# Patient Record
Sex: Male | Born: 1970 | Race: White | Hispanic: No | Marital: Married | Smoking: Never smoker
Health system: Southern US, Community
[De-identification: ages and names within clinical notes are randomized; demographics above are authoritative.]

## PROBLEM LIST (undated history)

## (undated) DIAGNOSIS — J309 Allergic rhinitis, unspecified: Secondary | ICD-10-CM

## (undated) HISTORY — DX: Allergic rhinitis, unspecified: J30.9

---

## 2013-07-31 DIAGNOSIS — J309 Allergic rhinitis, unspecified: Secondary | ICD-10-CM

## 2013-07-31 HISTORY — DX: Allergic rhinitis, unspecified: J30.9

## 2014-07-13 ENCOUNTER — Emergency Department
Admission: EM | Admit: 2014-07-13 | Discharge: 2014-07-13 | Disposition: A | Discharge status: Home / Self Care | Payer: 59 | Source: Home / Self Care | Attending: Emergency Medicine | Admitting: Emergency Medicine

## 2014-07-13 ENCOUNTER — Encounter: Payer: Self-pay | Admitting: Emergency Medicine

## 2014-07-13 DIAGNOSIS — R112 Nausea with vomiting, unspecified: Secondary | ICD-10-CM

## 2014-07-13 MED ORDER — ONDANSETRON 4 MG PO TBDP
4.0000 mg | ORAL_TABLET | Freq: Once | ORAL | Status: AC
  Administered 2014-07-13: 4 mg via ORAL

## 2014-07-13 MED ORDER — PROMETHAZINE HCL 25 MG PO TABS: 25.0000 mg | ORAL_TABLET | ORAL | Status: DC | PRN

## 2014-07-13 NOTE — Discharge Instructions (Signed)

## 2014-07-13 NOTE — ED Provider Notes (Signed)
CSN: [id number]     Arrival date & time 07/13/14  1525 History   First MD Initiated Contact with Patient 07/13/14 1545     Chief Complaint  Patient presents with  . Nausea  . Emesis   (Consider location/radiation/quality/duration/timing/severity/associated sxs/prior Treatment) Patient is a 43 y.o. male presenting with vomiting. The history is provided by the patient. No language interpreter was used.  Emesis Severity:  Moderate Duration:  3 days Timing:  Constant Number of daily episodes:  Multiple Quality:  Unable to specify Able to tolerate:  Liquids Progression:  Improving Chronicity:  New Recent urination:  Normal Relieved by:  Nothing Worsened by:  Nothing tried Ineffective treatments:  None tried Associated symptoms: no sore throat   Risk factors: no sick contacts     History reviewed. No pertinent past medical history. History reviewed. No pertinent past surgical history. History reviewed. No pertinent family history. History  Substance Use Topics  . Smoking status: Never Smoker   . Smokeless tobacco: Never Used  . Alcohol Use: No    Review of Systems  HENT: Negative for sore throat.   Gastrointestinal: Positive for nausea and vomiting.  All other systems reviewed and are negative.   Allergies  Review of patient's allergies indicates no known allergies.  Home Medications   Prior to Admission medications   Medication Sig Start Date End Date Taking? Authorizing Provider  cetirizine (ZYRTEC) 10 MG tablet Take 10 mg by mouth daily.   Yes Historical Provider, MD  promethazine (PHENERGAN) 25 MG tablet Take 1 tablet (25 mg total) by mouth every 4 (four) hours as needed for nausea or vomiting. 07/13/14   Lonia SkinnerLeslie K Sofia, PA-C   BP 120/80 mmHg  Pulse 55  Temp(Src) 98.1 F (36.7 C) (Oral)  Resp 16  Ht 5\' 10"  (1.778 m)  Wt 171 lb (77.565 kg)  BMI 24.54 kg/m2  SpO2 100% Physical Exam  Constitutional: He is oriented to person, place, and time. He appears  well-developed and well-nourished.  HENT:  Head: Normocephalic and atraumatic.  Right Ear: External ear normal.  Left Ear: External ear normal.  Eyes: Conjunctivae and EOM are normal. Pupils are equal, round, and reactive to light.  Neck: Normal range of motion.  Cardiovascular: Normal rate and normal heart sounds.   Pulmonary/Chest: Effort normal.  Abdominal: Soft. He exhibits no distension. There is tenderness.  Musculoskeletal: Normal range of motion.  Neurological: He is alert and oriented to person, place, and time.  Skin: Skin is warm.  Psychiatric: He has a normal mood and affect.  Nursing note and vitals reviewed.   ED Course  Procedures (including critical care time) Labs Review Labs Reviewed - No data to display  Imaging Review No results found.   MDM Probable viral illness.  Pt requesting phenergan.   Pt reports he has had in the past and has worked well.     1. Nausea and vomiting, vomiting of unspecified type    Pt advised to see Dr. Drue SecondSnider if symptoms persist past one week.  AVS  Elson AreasLeslie K Sofia, PA-C 07/13/14 [phone]

## 2014-07-13 NOTE — ED Notes (Signed)
Intermittent nausea, vomiting since Tuesday evening. Last episode yesterday at 8pm.  Symptoms worse at night.  Also complains of mild headache rating 4/10.

## 2014-08-13 ENCOUNTER — Ambulatory Visit (INDEPENDENT_AMBULATORY_CARE_PROVIDER_SITE_OTHER): Payer: 59 | Admitting: Family Medicine

## 2014-08-13 ENCOUNTER — Encounter: Payer: Self-pay | Admitting: Family Medicine

## 2014-08-13 VITALS — BP 122/70 | HR 64 | Ht 70.0 in | Wt 169.0 lb

## 2014-08-13 DIAGNOSIS — Z0000 Encounter for general adult medical examination without abnormal findings: Secondary | ICD-10-CM

## 2014-08-13 NOTE — Progress Notes (Signed)
CC: Willie RasmussenBrian Edwards is a 43 y.o. male is here for Establish Care and need CPE for insurance;no other concerns   Subjective: HPI:  Colonoscopy: no family history of colon cancer will begin screening at age 89 Prostate:no family history of prostate cancer will begin screening at age 43   Influenza Vaccine: declined Pneumovax: No current indication Td/Tdap: within last five years, outside records have been requested Zoster: (Start 41 yo)  Pleasant 43 year old, friend of Casey Burkittaron Saunders here to establish care  Rare alcohol use no tobacco or recreational drug use. Exercises 5 times a week with running and strength training.  Review of Systems - General ROS: negative for - chills, fever, night sweats, weight gain or weight loss Ophthalmic ROS: negative for - decreased vision Psychological ROS: negative for - anxiety or depression ENT ROS: negative for - hearing change, nasal congestion, tinnitus or allergies Hematological and Lymphatic ROS: negative for - bleeding problems, bruising or swollen lymph nodes Breast ROS: negative Respiratory ROS: no cough, shortness of breath, or wheezing Cardiovascular ROS: no chest pain or dyspnea on exertion Gastrointestinal ROS: no abdominal pain, change in bowel habits, or black or bloody stools Genito-Urinary ROS: negative for - genital discharge, genital ulcers, incontinence or abnormal bleeding from genitals Musculoskeletal ROS: negative for - joint pain or muscle pain Neurological ROS: negative for - headaches or memory loss Dermatological ROS: negative for lumps, mole changes, rash and skin lesion changes  History reviewed. No pertinent past medical history.  History reviewed. No pertinent past surgical history. Family History  Problem Relation Age of Onset  . Diabetes      grandmother     History   Social History  . Marital Status: Married    Spouse Name: N/A    Number of Children: N/A  . Years of Education: N/A   Occupational History  .  Not on file.   Social History Main Topics  . Smoking status: Never Smoker   . Smokeless tobacco: Never Used  . Alcohol Use: No  . Drug Use: No  . Sexual Activity:    Partners: Female   Other Topics Concern  . Not on file   Social History Narrative     Objective: BP 122/70 mmHg  Pulse 64  Ht 5\' 10"  (1.778 m)  Wt 169 lb (76.658 kg)  BMI 24.25 kg/m2  General: No Acute Distress HEENT: Atraumatic, normocephalic, conjunctivae normal without scleral icterus.  No nasal discharge, hearing grossly intact, TMs with good landmarks bilaterally with no middle ear abnormalities, posterior pharynx clear without oral lesions. Neck: Supple, trachea midline, no cervical nor supraclavicular adenopathy. Pulmonary: Clear to auscultation bilaterally without wheezing, rhonchi, nor rales. Cardiac: Regular rate and rhythm.  No murmurs, rubs, nor gallops. No peripheral edema.  2+ peripheral pulses bilaterally. Abdomen: Bowel sounds normal.  No masses.  Non-tender without rebound.  Negative Murphy's sign. GU: Bilateral descended testes with no inguinal hernia  MSK: Grossly intact, no signs of weakness.  Full strength throughout upper and lower extremities.  Full ROM in upper and lower extremities.  No midline spinal tenderness. Neuro: Gait unremarkable, CN II-XII grossly intact.  C5-C6 Reflex 2/4 Bilaterally, L4 Reflex 2/4 Bilaterally.  Cerebellar function intact. Skin: No rashes. Scattered cherry angiomas on the trunk Psych: Alert and oriented to person/place/time.  Thought process normal. No anxiety/depression.  Assessment & Plan: Willie JohnBrian was seen today for establish care and need cpe for insurance;no other concerns.  Diagnoses and associated orders for this visit:  Annual physical exam - Lipid  panel - COMPLETE METABOLIC PANEL WITH GFR - CBC    Healthy lifestyle interventions including but not limited to regular exercise, a healthy low fat diet, moderation of salt intake, the dangers of  tobacco/alcohol/recreational drug use, nutrition supplementation, and accident avoidance were discussed with the patient and a handout was provided for future reference.  Return if symptoms worsen or fail to improve.

## 2014-08-13 NOTE — Patient Instructions (Signed)
Dr. Tyrian Peart's General Advice Following Your Complete Physical Exam  The Benefits of Regular Exercise: Unless you suffer from an uncontrolled cardiovascular condition, studies strongly suggest that regular exercise and physical activity will add to both the quality and length of your life.  The World Health Organization recommends 150 minutes of moderate intensity aerobic activity every week.  This is best split over 3-4 days a week, and can be as simple as a brisk walk for just over 35 minutes "most days of the week".  This type of exercise has been shown to lower LDL-Cholesterol, lower average blood sugars, lower blood pressure, lower cardiovascular disease risk, improve memory, and increase one's overall sense of wellbeing.  The addition of anaerobic (or "strength training") exercises offers additional benefits including but not limited to increased metabolism, prevention of osteoporosis, and improved overall cholesterol levels.  How Can I Strive For A Low-Fat Diet?: Current guidelines recommend that 25-35 percent of your daily energy (food) intake should come from fats.  One might ask how can this be achieved without having to dissect each meal on a daily basis?  Switch to skim or 1% milk instead of whole milk.  Focus on lean meats such as ground turkey, fresh fish, baked chicken, and lean cuts of beef as your source of dietary protein.  Limit saturated fat consumption to less than 10% of your daily caloric intake.  Limit trans fatty acid consumption primarily by limiting synthetic trans fats such as partially hydrogenated oils (Ex: fried fast foods).  Substitute olive or vegetable oil for solid fats where possible.  Moderation of Salt Intake: Provided you don't carry a diagnosis of congestive heart failure nor renal failure, I recommend a daily allowance of no more than 2300 mg of salt (sodium).  Keeping under this daily goal is associated with a decreased risk of cardiovascular events, creeping  above it can lead to elevated blood pressures and increases your risk of cardiovascular events.  Milligrams (mg) of salt is listed on all nutrition labels, and your daily intake can add up faster than you think.  Most canned and frozen dinners can pack in over half your daily salt allowance in one meal.    Lifestyle Health Risks: Certain lifestyle choices carry specific health risks.  As you may already know, tobacco use has been associated with increasing one's risk of cardiovascular disease, pulmonary disease, numerous cancers, among many other issues.  What you may not know is that there are medications and nicotine replacement strategies that can more than double your chances of successfully quitting.  I would be thrilled to help manage your quitting strategy if you currently use tobacco products.  When it comes to alcohol use, I've yet to find an "ideal" daily allowance.  Provided an individual does not have a medical condition that is exacerbated by alcohol consumption, general guidelines determine "safe drinking" as no more than two standard drinks for a man or no more than one standard drink for a male per day.  However, much debate still exists on whether any amount of alcohol consumption is technically "safe".  My general advice, keep alcohol consumption to a minimum for general health promotion.  If you or others believe that alcohol, tobacco, or recreational drug use is interfering with your life, I would be happy to provide confidential counseling regarding treatment options.  General "Over The Counter" Nutrition Advice: Postmenopausal women should aim for a daily calcium intake of 1200 mg, however a significant portion of this might already be   provided by diets including milk, yogurt, cheese, and other dairy products.  Vitamin D has been shown to help preserve bone density, prevent fatigue, and has even been shown to help reduce falls in the elderly.  Ensuring a daily intake of 800 Units of  Vitamin D is a good place to start to enjoy the above benefits, we can easily check your Vitamin D level to see if you'd potentially benefit from supplementation beyond 800 Units a day.  Folic Acid intake should be of particular concern to women of childbearing age.  Daily consumption of 400-800 mcg of Folic Acid is recommended to minimize the chance of spinal cord defects in a fetus should pregnancy occur.    For many adults, accidents still remain one of the most common culprits when it comes to cause of death.  Some of the simplest but most effective preventitive habits you can adopt include regular seatbelt use, proper helmet use, securing firearms, and regularly testing your smoke and carbon monoxide detectors.  Willie Bennette B. Jakita Dutkiewicz DO [facility] [address], [address] , [place] [address] Phone: [phone]  

## 2014-08-20 ENCOUNTER — Encounter: Payer: 59 | Admitting: Family Medicine

## 2014-08-20 LAB — CBC
HCT: 45.2 % (ref 39.0–52.0)
HEMOGLOBIN: 15.1 g/dL (ref 13.0–17.0)
MCH: 27.2 pg (ref 26.0–34.0)
MCHC: 33.4 g/dL (ref 30.0–36.0)
MCV: 81.4 fL (ref 78.0–100.0)
MPV: 11.8 fL (ref 9.4–12.4)
PLATELETS: 152 10*3/uL (ref 150–400)
RBC: 5.55 MIL/uL (ref 4.22–5.81)
RDW: 14.6 % (ref 11.5–15.5)
WBC: 7.2 10*3/uL (ref 4.0–10.5)

## 2014-08-20 LAB — COMPLETE METABOLIC PANEL WITH GFR
ALT: 14 U/L (ref 0–53)
AST: 15 U/L (ref 0–37)
Albumin: 4.6 g/dL (ref 3.5–5.2)
Alkaline Phosphatase: 42 U/L (ref 39–117)
BILIRUBIN TOTAL: 0.7 mg/dL (ref 0.2–1.2)
BUN: 20 mg/dL (ref 6–23)
CO2: 30 mEq/L (ref 19–32)
CREATININE: 0.95 mg/dL (ref 0.50–1.35)
Calcium: 9.6 mg/dL (ref 8.4–10.5)
Chloride: 104 mEq/L (ref 96–112)
GFR, Est African American: >89 mL/min
Glucose, Bld: 90 mg/dL (ref 70–99)
Potassium: 4.4 mEq/L (ref 3.5–5.3)
Sodium: 141 mEq/L (ref 135–145)
Total Protein: 6.8 g/dL (ref 6.0–8.3)

## 2014-08-20 LAB — LIPID PANEL
CHOL/HDL RATIO: 3 ratio
Cholesterol: 139 mg/dL (ref 0–200)
HDL: 46 mg/dL (ref >39)
LDL Cholesterol: 80 mg/dL (ref 0–99)
TRIGLYCERIDES: 63 mg/dL (ref <150)
VLDL: 13 mg/dL (ref 0–40)

## 2014-11-12 ENCOUNTER — Encounter: Payer: Self-pay | Admitting: *Deleted

## 2014-11-12 ENCOUNTER — Emergency Department
Admission: EM | Admit: 2014-11-12 | Discharge: 2014-11-12 | Disposition: A | Discharge status: Home / Self Care | Payer: 59 | Source: Home / Self Care | Attending: Family Medicine | Admitting: Family Medicine

## 2014-11-12 ENCOUNTER — Emergency Department (INDEPENDENT_AMBULATORY_CARE_PROVIDER_SITE_OTHER): Payer: 59

## 2014-11-12 DIAGNOSIS — M940 Chondrocostal junction syndrome [Tietze]: Secondary | ICD-10-CM

## 2014-11-12 DIAGNOSIS — R0602 Shortness of breath: Secondary | ICD-10-CM | POA: Diagnosis not present

## 2014-11-12 MED ORDER — PREDNISONE 20 MG PO TABS: 20.0000 mg | ORAL_TABLET | Freq: Two times a day (BID) | ORAL | Status: DC

## 2014-11-12 NOTE — ED Provider Notes (Signed)
CSN: [id number]     Arrival date & time 11/12/14  1630 History   First MD Initiated Contact with Patient 11/12/14 1702     Chief Complaint  Patient presents with  . Shortness of Breath      HPI Comments: Patient complains of recurrent anterior chest tightness and sensation of dyspnea.  He has a history of costochondritis which seems to recur on a seasonal basis.  He generally runs 25 to 30 miles per week during warmer months, and has just resumed running.  He has no shortness of breath while running but feels chest tightness after finishing.  No cough or fever.  Patient is a 44 y.o. male presenting with chest pain. The history is provided by the patient.  Chest Pain Pain location:  Substernal area Pain quality: aching   Pain radiates to:  Does not radiate Pain radiates to the back: no   Pain severity:  Mild Onset quality:  Gradual Duration:  2 days Timing:  Constant Progression:  Unchanged Chronicity:  Recurrent Context: at rest   Context: not breathing, not eating, no movement, not raising an arm and no trauma   Relieved by:  Nothing Worsened by:  Nothing tried Ineffective treatments:  None tried Associated symptoms: shortness of breath   Associated symptoms: no abdominal pain, no AICD problem, no back pain, no cough, no diaphoresis, no dysphagia, no fatigue, no fever, no heartburn, no lower extremity edema, no nausea, no palpitations, no PND and not vomiting     History reviewed. No pertinent past medical history. History reviewed. No pertinent past surgical history. Family History  Problem Relation Age of Onset  . Diabetes      grandmother    History  Substance Use Topics  . Smoking status: Never Smoker   . Smokeless tobacco: Never Used  . Alcohol Use: No    Review of Systems  Constitutional: Negative for fever, diaphoresis and fatigue.  HENT: Negative for trouble swallowing.   Respiratory: Positive for shortness of breath. Negative for cough.   Cardiovascular:  Positive for chest pain. Negative for palpitations and PND.  Gastrointestinal: Negative for heartburn, nausea, vomiting and abdominal pain.  Musculoskeletal: Negative for back pain.  All other systems reviewed and are negative.   Allergies  Review of patient's allergies indicates no known allergies.  Home Medications   Prior to Admission medications   Medication Sig Start Date End Date Taking? Authorizing Provider  cetirizine (ZYRTEC) 10 MG tablet Take 10 mg by mouth daily.    Historical Provider, MD  predniSONE (DELTASONE) 20 MG tablet Take 1 tablet (20 mg total) by mouth 2 (two) times daily. Take with food. 11/12/14   Lattie Haw, MD   BP 134/76 mmHg  Pulse 55  Temp(Src) 98.1 F (36.7 C) (Oral)  Resp 18  Ht  (1.803 m)  Wt 170 lb (77.111 kg)  BMI 23.72 kg/m2  SpO2 100% Physical Exam  Constitutional: He is oriented to person, place, and time. He appears well-developed and well-nourished. No distress.  HENT:  Head: Normocephalic.  Nose: Nose normal.  Mouth/Throat: Oropharynx is clear and moist.  Eyes: Conjunctivae and EOM are normal. Pupils are equal, round, and reactive to light.  Neck: Neck supple.  Cardiovascular: Normal heart sounds.   Pulmonary/Chest: Breath sounds normal. No respiratory distress. He has no wheezes. He has no rales. He exhibits tenderness.  Abdominal: There is no tenderness.    There is distinct tenderness over lower sternum extending to costal margins as noted  on diagram.    Musculoskeletal: He exhibits no edema.  Lymphadenopathy:    He has no cervical adenopathy.  Neurological: He is alert and oriented to person, place, and time.  Skin: Skin is warm and dry. No rash noted.  Nursing note and vitals reviewed.   ED Course  Procedures  none  Imaging Review Dg Chest 2 View  11/12/2014   CLINICAL DATA:  Shortness of breath and chest tightness for 1 day  EXAM: CHEST  2 VIEW  COMPARISON:  None.  FINDINGS: The heart size and mediastinal  contours are within normal limits. There is mild increased pulmonary interstitium bilaterally. There is no focal pneumonia or pleural effusion. There is no pulmonary edema. The visualized skeletal structures are unremarkable.  IMPRESSION: Mild increased pulmonary interstitium bilaterally. This is nonspecific but can be seen in bronchitis.   Electronically Signed   By: Sherian ReinWei-Chen  Lin M.D.   On: 11/12/2014 17:34     MDM   1. Costochondritis    Begin prednisone burst. Apply ice pack for 20 to 30 minutes, 3 to 4 times daily  Continue until pain decreases.   After finishing prednisone, may continue Ibuprofen 200mg , 4 tabs every 8 hours with food for several days. Followup with Dr. Rodney Langtonhomas Thekkekand[facility] ([facility]) if not improving about two weeks.     Lattie HawStephen A Beese, MD 11/14/14 [phone]

## 2014-11-12 NOTE — Discharge Instructions (Signed)
Apply ice pack for 20 to 30 minutes, 3 to 4 times daily  Continue until pain decreases.   After finishing prednisone, may continue Ibuprofen 200mg , 4 tabs every 8 hours with food for several days.   Costochondritis Costochondritis, sometimes called Tietze syndrome, is a swelling and irritation (inflammation) of the tissue (cartilage) that connects your ribs with your breastbone (sternum). It causes pain in the chest and rib area. Costochondritis usually goes away on its own over time. It can take up to 6 weeks or longer to get better, especially if you are unable to limit your activities. CAUSES  Some cases of costochondritis have no known cause. Possible causes include:  Injury (trauma).  Exercise or activity such as lifting.  Severe coughing. SIGNS AND SYMPTOMS  Pain and tenderness in the chest and rib area.  Pain that gets worse when coughing or taking deep breaths.  Pain that gets worse with specific movements. DIAGNOSIS  Your health care provider will do a physical exam and ask about your symptoms. Chest X-rays or other tests may be done to rule out other problems. TREATMENT  Costochondritis usually goes away on its own over time. Your health care provider may prescribe medicine to help relieve pain. HOME CARE INSTRUCTIONS   Avoid exhausting physical activity. Try not to strain your ribs during normal activity. This would include any activities using chest, abdominal, and side muscles, especially if heavy weights are used.  Apply ice to the affected area for the first 2 days after the pain begins.  Put ice in a plastic bag.  Place a towel between your skin and the bag.  Leave the ice on for 20 minutes, 2-3 times a day.  Only take over-the-counter or prescription medicines as directed by your health care provider. SEEK MEDICAL CARE IF:  You have redness or swelling at the rib joints. These are signs of infection.  Your pain does not go away despite rest or  medicine. SEEK IMMEDIATE MEDICAL CARE IF:   Your pain increases or you are very uncomfortable.  You have shortness of breath or difficulty breathing.  You cough up blood.  You have worse chest pains, sweating, or vomiting.  You have a fever or persistent symptoms for more than 2-3 days.  You have a fever and your symptoms suddenly get worse. MAKE SURE YOU:   Understand these instructions.  Will watch your condition.  Will get help right away if you are not doing well or get worse. Document Released: 05/20/2005 Document Revised: 05/31/2013 Document Reviewed: 03/14/2013 [facility] Patient Information 2015 [place]. This information is not intended to replace advice given to you by your health care provider. Make sure you discuss any questions you have with your health care provider.

## 2014-11-12 NOTE — ED Notes (Signed)
Pt c/o SOB x 2 days with bilateral rib pain. He reports a hx of costochondritis.

## 2014-11-13 ENCOUNTER — Ambulatory Visit: Payer: Self-pay | Admitting: Family Medicine

## 2014-11-15 ENCOUNTER — Telehealth: Payer: Self-pay | Admitting: Emergency Medicine

## 2014-11-15 NOTE — ED Notes (Signed)
Inquired about patient's status; encourage them to call with questions/concerns.  

## 2015-08-05 ENCOUNTER — Ambulatory Visit (INDEPENDENT_AMBULATORY_CARE_PROVIDER_SITE_OTHER): Payer: 59 | Admitting: Family Medicine

## 2015-08-05 ENCOUNTER — Encounter: Payer: Self-pay | Admitting: Family Medicine

## 2015-08-05 VITALS — BP 105/65 | HR 78 | Wt 170.0 lb

## 2015-08-05 DIAGNOSIS — Z0000 Encounter for general adult medical examination without abnormal findings: Secondary | ICD-10-CM

## 2015-08-05 NOTE — Patient Instructions (Signed)
Dr. Isys Tietje's General Advice Following Your Complete Physical Exam  The Benefits of Regular Exercise: Unless you suffer from an uncontrolled cardiovascular condition, studies strongly suggest that regular exercise and physical activity will add to both the quality and length of your life.  The World Health Organization recommends 150 minutes of moderate intensity aerobic activity every week.  This is best split over 3-4 days a week, and can be as simple as a brisk walk for just over 35 minutes "most days of the week".  This type of exercise has been shown to lower LDL-Cholesterol, lower average blood sugars, lower blood pressure, lower cardiovascular disease risk, improve memory, and increase one's overall sense of wellbeing.  The addition of anaerobic (or "strength training") exercises offers additional benefits including but not limited to increased metabolism, prevention of osteoporosis, and improved overall cholesterol levels.  How Can I Strive For A Low-Fat Diet?: Current guidelines recommend that 25-35 percent of your daily energy (food) intake should come from fats.  One might ask how can this be achieved without having to dissect each meal on a daily basis?  Switch to skim or 1% milk instead of whole milk.  Focus on lean meats such as ground turkey, fresh fish, baked chicken, and lean cuts of beef as your source of dietary protein.  Limit saturated fat consumption to less than 10% of your daily caloric intake.  Limit trans fatty acid consumption primarily by limiting synthetic trans fats such as partially hydrogenated oils (Ex: fried fast foods).  Substitute olive or vegetable oil for solid fats where possible.  Moderation of Salt Intake: Provided you don't carry a diagnosis of congestive heart failure nor renal failure, I recommend a daily allowance of no more than 2300 mg of salt (sodium).  Keeping under this daily goal is associated with a decreased risk of cardiovascular events, creeping  above it can lead to elevated blood pressures and increases your risk of cardiovascular events.  Milligrams (mg) of salt is listed on all nutrition labels, and your daily intake can add up faster than you think.  Most canned and frozen dinners can pack in over half your daily salt allowance in one meal.    Lifestyle Health Risks: Certain lifestyle choices carry specific health risks.  As you may already know, tobacco use has been associated with increasing one's risk of cardiovascular disease, pulmonary disease, numerous cancers, among many other issues.  What you may not know is that there are medications and nicotine replacement strategies that can more than double your chances of successfully quitting.  I would be thrilled to help manage your quitting strategy if you currently use tobacco products.  When it comes to alcohol use, I've yet to find an "ideal" daily allowance.  Provided an individual does not have a medical condition that is exacerbated by alcohol consumption, general guidelines determine "safe drinking" as no more than two standard drinks for a man or no more than one standard drink for a male per day.  However, much debate still exists on whether any amount of alcohol consumption is technically "safe".  My general advice, keep alcohol consumption to a minimum for general health promotion.  If you or others believe that alcohol, tobacco, or recreational drug use is interfering with your life, I would be happy to provide confidential counseling regarding treatment options.  General "Over The Counter" Nutrition Advice: Postmenopausal women should aim for a daily calcium intake of 1200 mg, however a significant portion of this might already be   provided by diets including milk, yogurt, cheese, and other dairy products.  Vitamin D has been shown to help preserve bone density, prevent fatigue, and has even been shown to help reduce falls in the elderly.  Ensuring a daily intake of 800 Units of  Vitamin D is a good place to start to enjoy the above benefits, we can easily check your Vitamin D level to see if you'd potentially benefit from supplementation beyond 800 Units a day.  Folic Acid intake should be of particular concern to women of childbearing age.  Daily consumption of 400-800 mcg of Folic Acid is recommended to minimize the chance of spinal cord defects in a fetus should pregnancy occur.    For many adults, accidents still remain one of the most common culprits when it comes to cause of death.  Some of the simplest but most effective preventitive habits you can adopt include regular seatbelt use, proper helmet use, securing firearms, and regularly testing your smoke and carbon monoxide detectors.  Najmah Carradine B. Wilmoth Rasnic DO [facility] [address], [address] [place], [place] [address] Phone: [phone]  

## 2015-08-05 NOTE — Progress Notes (Signed)
CC: Willie Edwards is a 44 y.o. male is here for Annual Exam   Subjective: HPI:  Colonoscopy: no current indication Prostate: Discussed screening risks/beneifts with patient today, screening to begin at 50  Influenza Vaccine: declined Pneumovax: no current indication  Td/Tdap: UTD Zoster: (Start 42 yo)  Requesting CPE with no complaints  Review of Systems - General ROS: negative Ophthalmic ROS: negative Psychological ROS: negative ENT ROS: negative Hematological and Lymphatic ROS: negative Breast ROS: negative Respiratory ROS: no cough, shortness of breath, or wheezing Cardiovascular ROS: no chest pain or dyspnea on exertion Gastrointestinal ROS: no abdominal pain, change in bowel habits, or black or bloody stools Genito-Urinary ROS: no dysuria, trouble voiding, or hematuria Musculoskeletal ROS: negative Neurological ROS: negative Dermatological ROS: negative    No past medical history on file.  No past surgical history on file. Family History  Problem Relation Age of Onset  . Diabetes      grandmother     Social History   Social History  . Marital Status: Married    Spouse Name: N/A  . Number of Children: N/A  . Years of Education: N/A   Occupational History  . Not on file.   Social History Main Topics  . Smoking status: Never Smoker   . Smokeless tobacco: Never Used  . Alcohol Use: No  . Drug Use: No  . Sexual Activity:    Partners: Female   Other Topics Concern  . Not on file   Social History Narrative     Objective: BP 105/65 mmHg  Pulse 78  Wt 170 lb (77.111 kg)  General: No Acute Distress HEENT: Atraumatic, normocephalic, conjunctivae normal without scleral icterus.  No nasal discharge, hearing grossly intact, TMs with good landmarks bilaterally with no middle ear abnormalities, posterior pharynx clear without oral lesions. Neck: Supple, trachea midline, no cervical nor supraclavicular adenopathy. Pulmonary: Clear to auscultation bilaterally  without wheezing, rhonchi, nor rales. Cardiac: Regular rate and rhythm.  No murmurs, rubs, nor gallops. No peripheral edema.  2+ peripheral pulses bilaterally. Abdomen: Bowel sounds normal.  No masses.  Non-tender without rebound.  Negative Murphy's sign. MSK: Grossly intact, no signs of weakness.  Full strength throughout upper and lower extremities.  Full ROM in upper and lower extremities.  No midline spinal tenderness. Neuro: Gait unremarkable, CN II-XII grossly intact.  C5-C6 Reflex 2/4 Bilaterally, L4 Reflex 2/4 Bilaterally.  Cerebellar function intact. Skin: No rashes. Psych: Alert and oriented to person/place/time.  Thought process normal. No anxiety/depression.  Assessment & Plan: Willie JohnBrian was seen today for annual exam.  Diagnoses and all orders for this visit:  Annual physical exam -     Lipid panel -     COMPLETE METABOLIC PANEL WITH GFR -     CBC  Healthy lifestyle interventions including but not limited to regular exercise, a healthy low fat diet, moderation of salt intake, the dangers of tobacco/alcohol/recreational drug use, nutrition supplementation, and accident avoidance were discussed with the patient and a handout was provided for future reference.  Return in about 1 year (around 08/04/2016).

## 2015-08-14 ENCOUNTER — Ambulatory Visit (INDEPENDENT_AMBULATORY_CARE_PROVIDER_SITE_OTHER): Payer: 59 | Admitting: Family Medicine

## 2015-08-14 VITALS — BP 118/73 | HR 60 | Temp 97.8°F | Ht 71.0 in | Wt 170.0 lb

## 2015-08-14 DIAGNOSIS — Z23 Encounter for immunization: Secondary | ICD-10-CM | POA: Diagnosis not present

## 2015-08-14 NOTE — Progress Notes (Signed)
   Subjective:    Patient ID: Willie Edwards, male    DOB: 10/14/1970, 44 y.o.   MRN: [id number]  HPI  Patient is here for flu vaccine.  Patient in good health.  Denies any problems with vaccine.  Review of Systems     Objective:   Physical Exam        Assessment & Plan:   Patient tolerated injection well without complication.

## 2015-11-19 ENCOUNTER — Ambulatory Visit (INDEPENDENT_AMBULATORY_CARE_PROVIDER_SITE_OTHER): Payer: 59 | Admitting: Family Medicine

## 2015-11-19 ENCOUNTER — Encounter: Payer: Self-pay | Admitting: Family Medicine

## 2015-11-19 VITALS — BP 124/73 | HR 65 | Temp 99.0°F | Wt 179.0 lb

## 2015-11-19 DIAGNOSIS — R509 Fever, unspecified: Secondary | ICD-10-CM | POA: Diagnosis not present

## 2015-11-19 LAB — POC INFLUENZA A&B (BINAX/QUICKVUE)
INFLUENZA B, POC: NEGATIVE
Influenza A, POC: NEGATIVE

## 2015-11-19 NOTE — Progress Notes (Signed)
CC: Willie RasmussenBrian Lollar is a 45 y.o. male is here for Flu Sx   Subjective: HPI:  Starting on Sunday patient noticed a sudden onset of mild myalgias, chills and a fever 101.3. He felt fatigued and spent the majority of the day in bed. Yesterday he felt similar but today started to feel little bit better. He is worried that he might have the flu. No interventions as of yet. He denies any change in diet or thirst. He denies any diarrhea or constipation and has not experienced any nausea. He denies any abdominal pain, headache, nasal congestion, sore throat or rash. denies cough or wheezing, no shortness of breath   Review Of Systems Outlined In HPI  No past medical history on file.  No past surgical history on file. Family History  Problem Relation Age of Onset  . Diabetes      grandmother     Social History   Social History  . Marital Status: Married    Spouse Name: N/A  . Number of Children: N/A  . Years of Education: N/A   Occupational History  . Not on file.   Social History Main Topics  . Smoking status: Never Smoker   . Smokeless tobacco: Never Used  . Alcohol Use: No  . Drug Use: No  . Sexual Activity:    Partners: Female   Other Topics Concern  . Not on file   Social History Narrative     Objective: BP 124/73 mmHg  Pulse 65  Temp(Src) 99 F (37.2 C) (Oral)  Wt 179 lb (81.194 kg)  General: Alert and Oriented, No Acute Distress HEENT: Pupils equal, round, reactive to light. Conjunctivae clear.  External ears unremarkable, canals clear with intact TMs with appropriate landmarks.  Middle ear appears open without effusion. Pink inferior turbinates.  Moist mucous membranes, pharynx without inflammation nor lesions.  Neck supple without palpable lymphadenopathy nor abnormal masses. Lungs: Clear to auscultation bilaterally, no wheezing/ronchi/rales.  Comfortable work of breathing. Good air movement. Cardiac: Regular rate and rhythm. Normal S1/S2.  No murmurs, rubs, nor  gallops.   Extremities: No peripheral edema.  Strong peripheral pulses.  Mental Status: No depression, anxiety, nor agitation. Skin: Warm and dry.  Assessment & Plan: Arlys JohnBrian was seen today for flu sx.  Diagnoses and all orders for this visit:  Fever and chills -     POC Influenza A&B (Binax test)   Fevers and chills with a negative flu test, discussed that he most likely has a viral illness that started beginning to resolve on its own. Urged to start taking it 100 mg of ibuprofen 3 times a day if needed for myalgias and fever.  Return if symptoms worsen or fail to improve.

## 2016-06-10 ENCOUNTER — Ambulatory Visit (INDEPENDENT_AMBULATORY_CARE_PROVIDER_SITE_OTHER): Payer: 59 | Admitting: Family Medicine

## 2016-06-10 ENCOUNTER — Encounter: Payer: Self-pay | Admitting: Family Medicine

## 2016-06-10 VITALS — BP 119/74 | HR 58 | Ht 70.0 in | Wt 170.0 lb

## 2016-06-10 DIAGNOSIS — Z283 Underimmunization status: Secondary | ICD-10-CM

## 2016-06-10 DIAGNOSIS — Z23 Encounter for immunization: Secondary | ICD-10-CM

## 2016-06-10 DIAGNOSIS — Z0000 Encounter for general adult medical examination without abnormal findings: Secondary | ICD-10-CM | POA: Diagnosis not present

## 2016-06-10 DIAGNOSIS — Z2839 Other underimmunization status: Secondary | ICD-10-CM

## 2016-06-10 NOTE — Progress Notes (Signed)
       Hayden RasmussenBrian Modisette is a 45 y.o. male who [facility] today for well visit. Patient is a healthy man with essentially no significant medical problems. He exercises regularly and does not take any medications nor does he have any significant medication allergies. No fevers or chills vomiting or diarrhea. He is asymptomatic today.   No past medical history on file. No past surgical history on file. No significant history Social History  Substance Use Topics  . Smoking status: Never Smoker  . Smokeless tobacco: Never Used  . Alcohol use No   family history is not on file.  ROS as above: No headache, visual changes, nausea, vomiting, diarrhea, constipation, dizziness, abdominal pain, skin rash, fevers, chills, night sweats, weight loss, swollen lymph nodes, body aches, joint swelling, muscle aches, chest pain, shortness of breath, mood changes, visual or auditory hallucinations.   Medications: Current Outpatient Prescriptions  Medication Sig Dispense Refill  . cetirizine (ZYRTEC) 10 MG tablet Take 10 mg by mouth daily.     No current facility-administered medications for this visit.    No Known Allergies  Health Maintenance Health Maintenance  Topic Date Due  . TETANUS/TDAP  06/10/2026  . INFLUENZA VACCINE  Completed  . HIV Screening  Addressed     Exam:  BP 119/74   Pulse (!) 58   Ht 5\' 10"  (1.778 m)   Wt 170 lb (77.1 kg)   BMI 24.39 kg/m  Gen: Well NAD HEENT: EOMI,  MMM Lungs: Normal work of breathing. CTABL Heart: RRR no MRG Abd: NABS, Soft. Nondistended, Nontender Exts: Brisk capillary refill, warm and well perfused.    No results found for this or any previous visit (from the past 72 hour(s)). No results found.    Assessment and Plan: 45 y.o. male with Healthy adult with essentially no medical problems. Recommend continuing exercise.  Recheck in 1 year or sooner if needed. Influenza and Tdap vaccines given today.   Orders Placed This Encounter  Procedures  . Tdap vaccine greater than or equal to 7yo IM  . Flu Vaccine QUAD 36+ mos IM    Discussed warning signs or symptoms. Please see discharge instructions. Patient expresses understanding.

## 2016-06-10 NOTE — Patient Instructions (Signed)
Thank you for coming in today. Return in 1 year or sooner if needed.    

## 2016-09-04 IMAGING — CR DG CHEST 2V
2 series · 2 of 2 positions shown · non-contrast
Comparison: None.

CLINICAL DATA: Shortness of breath and chest tightness for 1 day

EXAM:
CHEST  2 VIEW

[chest pa]
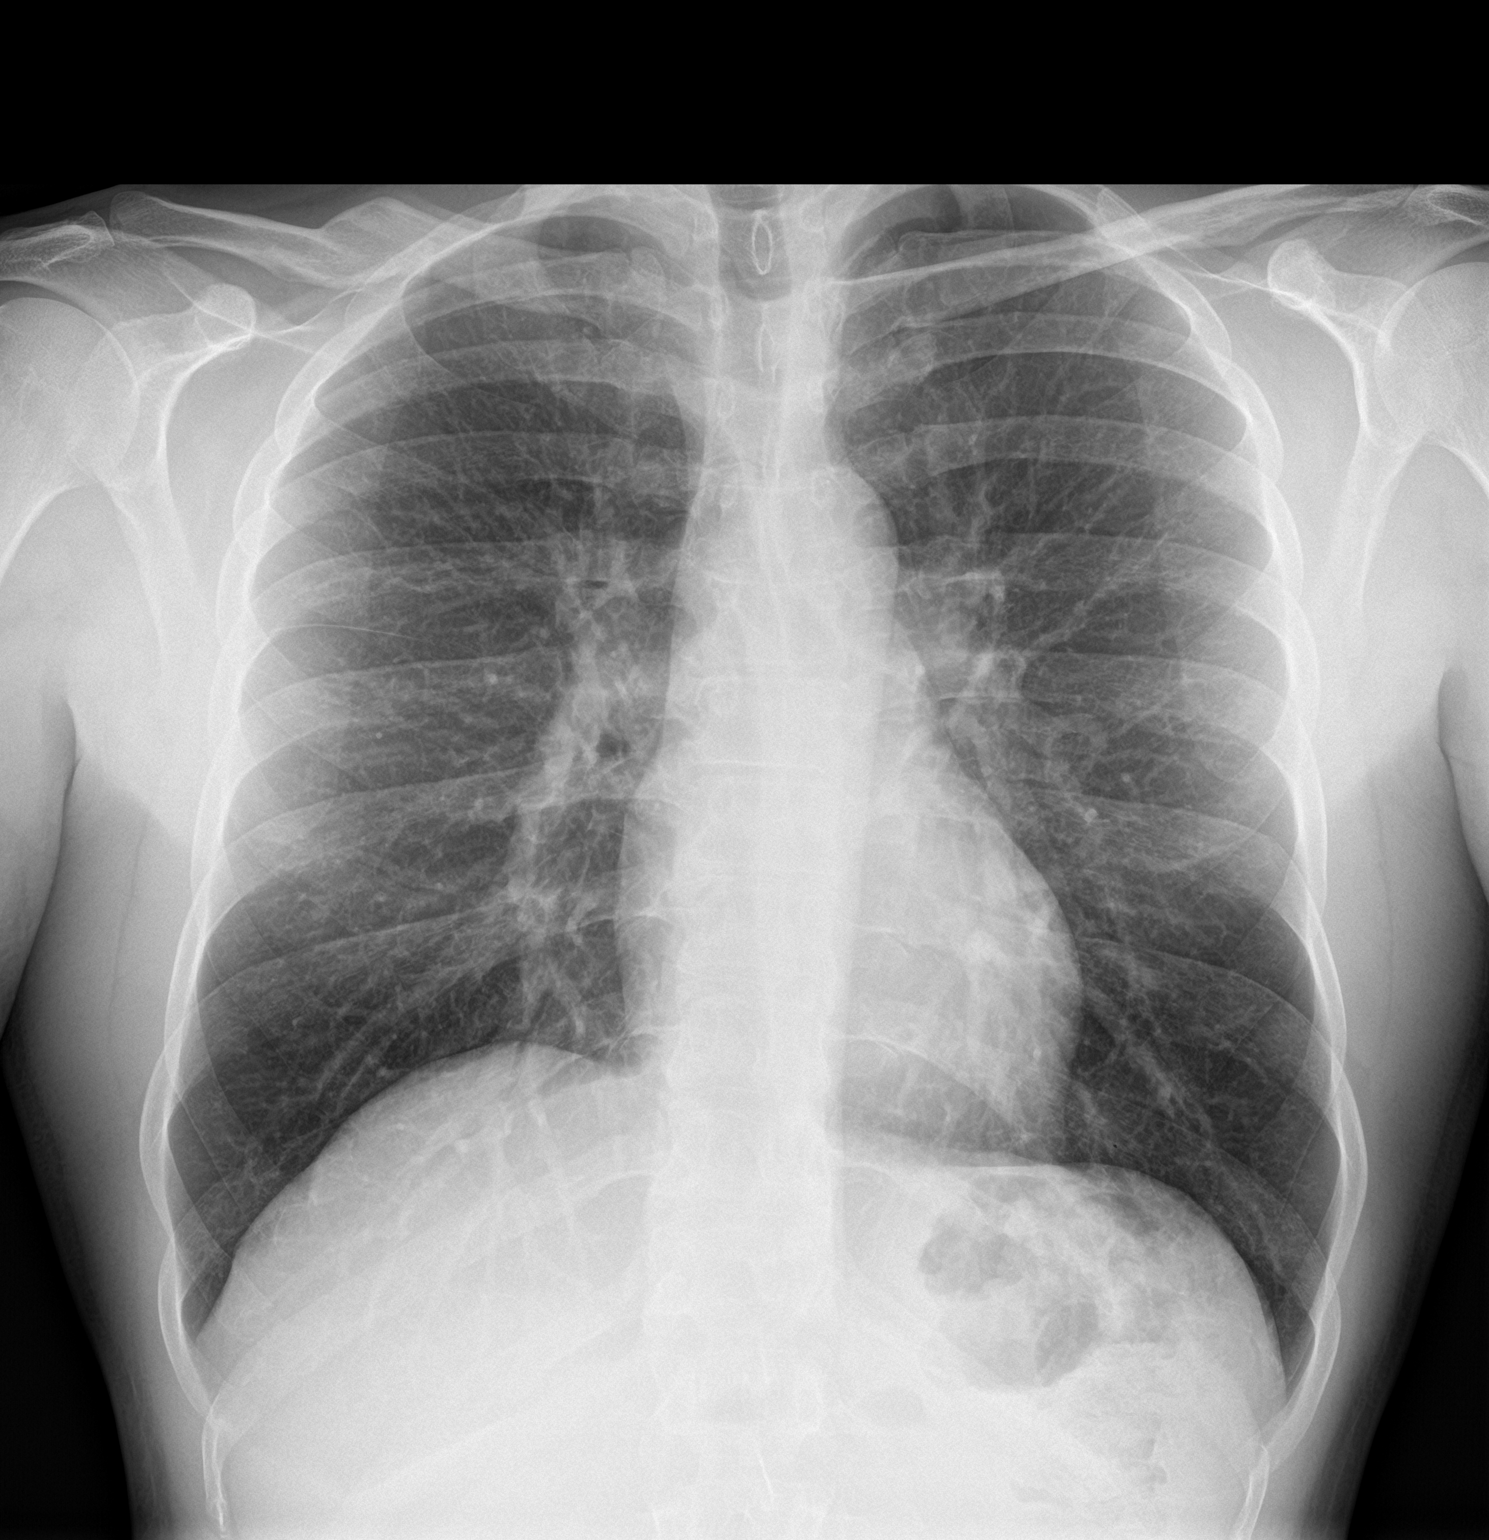

[chest lat]
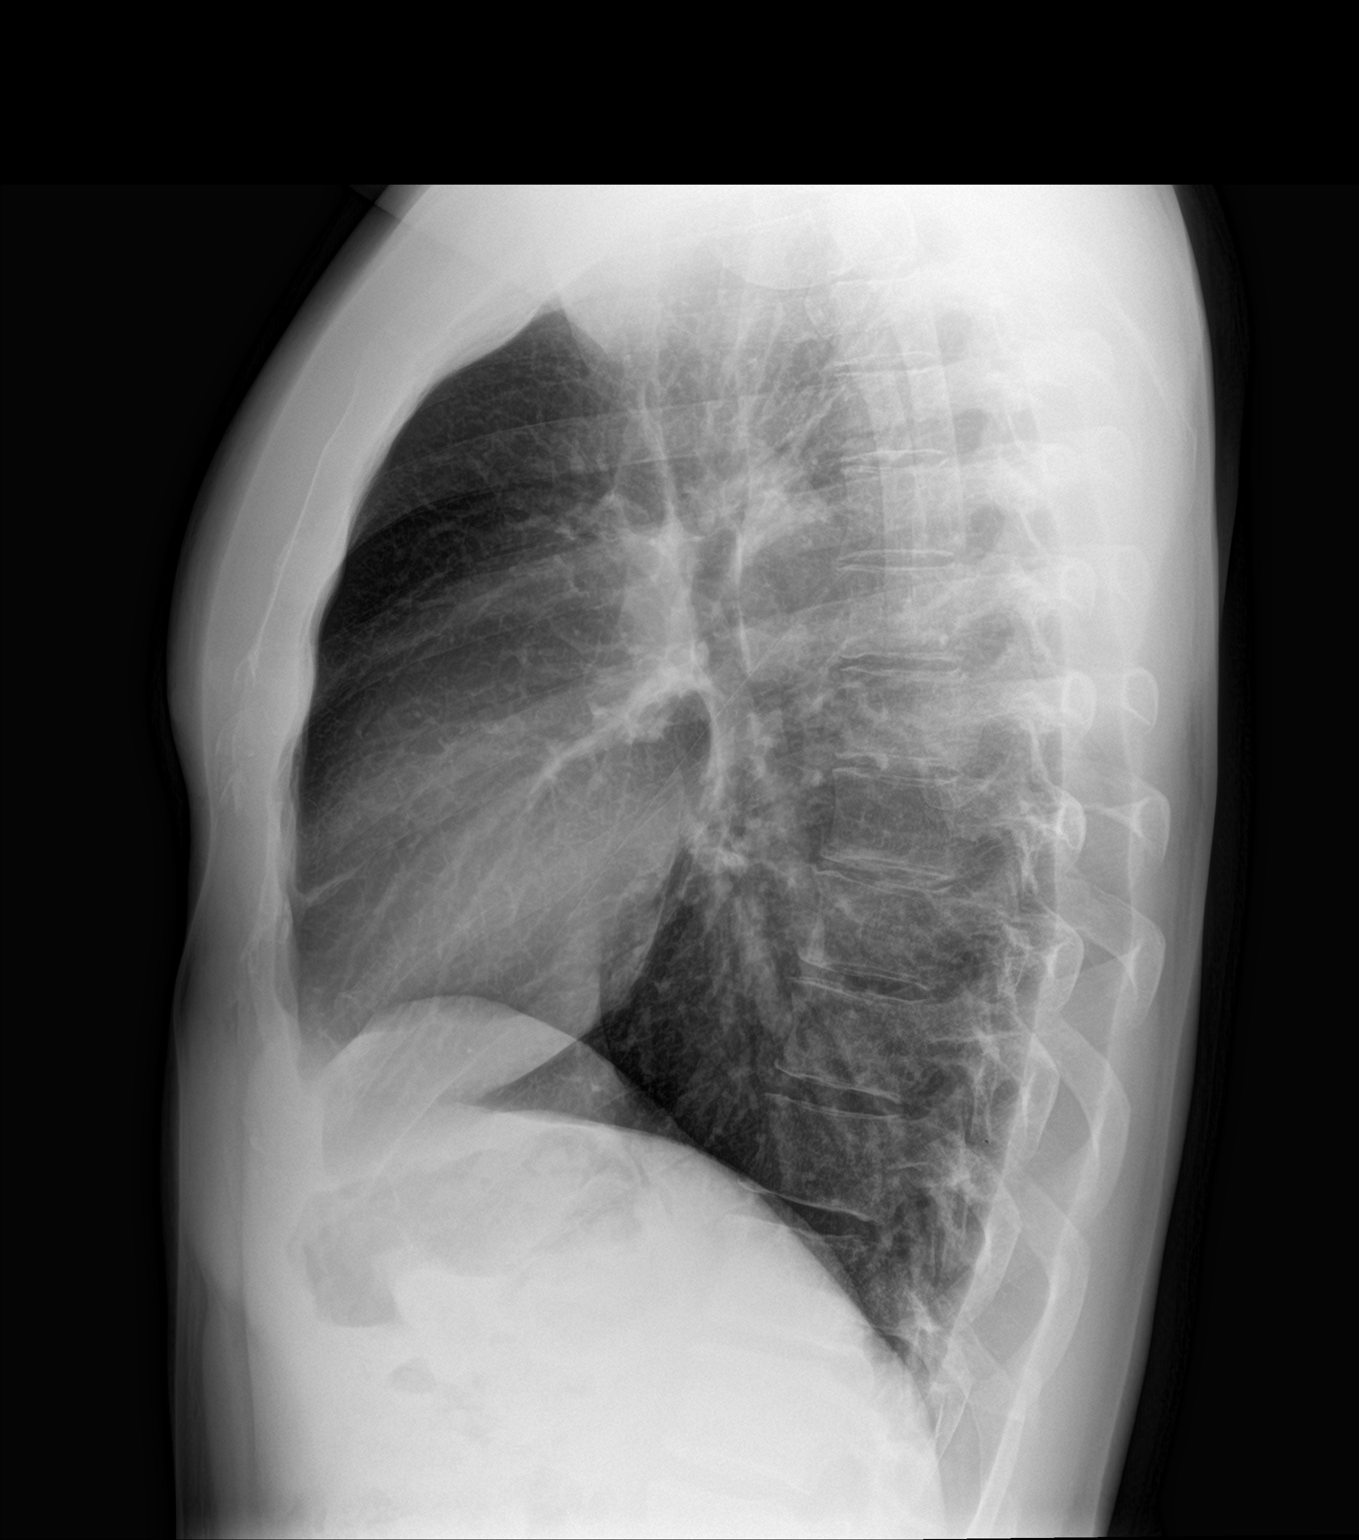

[2 of 2 positions shown; findings below may reference images not displayed]

FINDINGS: The heart size and mediastinal contours are within normal limits.
There is mild increased pulmonary interstitium bilaterally. There is
no focal pneumonia or pleural effusion. There is no pulmonary edema.
The visualized skeletal structures are unremarkable.
IMPRESSION: Mild increased pulmonary interstitium bilaterally. This is
nonspecific but can be seen in bronchitis.

## 2017-07-07 ENCOUNTER — Encounter: Payer: Self-pay | Admitting: Family Medicine

## 2017-07-07 ENCOUNTER — Ambulatory Visit (INDEPENDENT_AMBULATORY_CARE_PROVIDER_SITE_OTHER): Payer: 59 | Admitting: Family Medicine

## 2017-07-07 VITALS — BP 120/83 | HR 75 | Temp 98.0°F | Ht 71.0 in | Wt 168.1 lb

## 2017-07-07 DIAGNOSIS — Z23 Encounter for immunization: Secondary | ICD-10-CM | POA: Diagnosis not present

## 2017-07-07 DIAGNOSIS — B354 Tinea corporis: Secondary | ICD-10-CM | POA: Diagnosis not present

## 2017-07-07 DIAGNOSIS — Z0000 Encounter for general adult medical examination without abnormal findings: Secondary | ICD-10-CM

## 2017-07-07 MED ORDER — TERBINAFINE HCL 250 MG PO TABS: 250.0000 mg | ORAL_TABLET | Freq: Every day | ORAL | 1 refills | Status: AC

## 2017-07-07 NOTE — Patient Instructions (Signed)
Thank you for coming in today. Take terbinafine daily for 2 weeks.  If not better let me know.  You should probably have an annual skin exam with dermatology.  Let me know if you need a referral.    Body Ringworm Body ringworm is an infection of the skin that often causes a ring-shaped rash. Body ringworm can affect any part of your skin. It can spread easily to others. Body ringworm is also called tinea corporis. What are the causes? This condition is caused by funguses called dermatophytes. The condition develops when these funguses grow out of control on the skin. You can get this condition if you touch a person or animal that has it. You can also get it if you share clothing, bedding, towels, or any other object with an infected person or pet. What increases the risk? This condition is more likely to develop in:  Athletes who often make skin-to-skin contact with other athletes, such as wrestlers.  People who share equipment and mats.  People with a weakened immune system.  What are the signs or symptoms? Symptoms of this condition include:  Itchy, raised red spots and bumps.  Red scaly patches.  A ring-shaped rash. The rash may have: ? A clear center. ? Scales or red bumps at its center. ? Redness near its borders. ? Dry and scaly skin on or around it.  How is this diagnosed? This condition can usually be diagnosed with a skin exam. A skin scraping may be taken from the affected area and examined under a microscope to see if the fungus is present. How is this treated? This condition may be treated with:  An antifungal cream or ointment.  An antifungal shampoo.  Antifungal medicines. These may be prescribed if your ringworm is severe, keeps coming back, or lasts a long time.  Follow these instructions at home:  Take over-the-counter and prescription medicines only as told by your health care provider.  If you were given an antifungal cream or ointment: ? Use it as  told by your health care provider. ? Wash the infected area and dry it completely before applying the cream or ointment.  If you were given an antifungal shampoo: ? Use it as told by your health care provider. ? Leave the shampoo on your body for 3-5 minutes before rinsing.  While you have a rash: ? Wear loose clothing to stop clothes from rubbing and irritating it. ? Wash or change your bed sheets every night.  If your pet has the same infection, take your pet to see a International aid/development worker. How is this prevented?  Practice good hygiene.  Wear sandals or shoes in public places and showers.  Do not share personal items with others.  Avoid touching red patches of skin on other people.  Avoid touching pets that have bald spots.  If you touch an animal that has a bald spot, wash your hands. Contact a health care provider if:  Your rash continues to spread after 7 days of treatment.  Your rash is not gone in 4 weeks.  The area around your rash gets red, warm, tender, and swollen. This information is not intended to replace advice given to you by your health care provider. Make sure you discuss any questions you have with your health care provider. Document Released: 08/07/2000 Document Revised: 01/16/2016 Document Reviewed: 06/06/2015 Elsevier Interactive Patient Education  2018 [employer].  Terbinafine tablets What is this medicine? TERBINAFINE (TER bin a feen) is an antifungal medicine.  It is used to treat certain kinds of fungal or yeast infections. This medicine may be used for other purposes; ask your health care provider or pharmacist if you have questions. COMMON BRAND NAME(S): Lamisil, Terbinex What should I tell my health care provider before I take this medicine? They need to know if you have any of these conditions: -drink alcoholic beverages -kidney disease -liver disease -an unusual or allergic reaction to terbinafine, other medicines, foods, dyes, or  preservatives -pregnant or trying to get pregnant -breast-feeding How should I use this medicine? Take this medicine by mouth with a full glass of water. Follow the directions on the prescription label. You can take this medicine with food or on an empty stomach. Take your medicine at regular intervals. Do not take your medicine more often than directed. Do not skip doses or stop your medicine early even if you feel better. Do not stop taking except on your doctor's advice. Talk to your pediatrician regarding the use of this medicine in children. Special care may be needed. Overdosage: If you think you have taken too much of this medicine contact a poison control center or emergency room at once. NOTE: This medicine is only for you. Do not share this medicine with others. What if I miss a dose? If you miss a dose, take it as soon as you can. If it is almost time for your next dose, take only that dose. Do not take double or extra doses. What may interact with this medicine? Do not take this medicine with any of the following medications: -thioridazine This medicine may also interact with the following medications: -beta-blockers -caffeine -cimetidine -cyclosporine -medicines for depression, anxiety, or psychotic disturbances -medicines for fungal infections like fluconazole and ketoconazole -medicines for irregular heartbeat like amiodarone, flecainide and propafenone -rifampin -warfarin This list may not describe all possible interactions. Give your health care provider a list of all the medicines, herbs, non-prescription drugs, or dietary supplements you use. Also tell them if you smoke, drink alcohol, or use illegal drugs. Some items may interact with your medicine. What should I watch for while using this medicine? Visit your doctor or health care provider regularly. Tell your doctor right away if you have nausea or vomiting, loss of appetite, stomach pain on your right upper side, yellow  skin, dark urine, light stools, or are over tired. Some fungal infections need many weeks or months of treatment to cure. If you are taking this medicine for a long time, you will need to have important blood work done. What side effects may I notice from receiving this medicine? Side effects that you should report to your doctor or health care professional as soon as possible: -allergic reactions like skin rash or hives, swelling of the face, lips, or tongue -changes in vision -dark urine -fever or infection -general ill feeling or flu-like symptoms -light-colored stools -loss of appetite, nausea -redness, blistering, peeling or loosening of the skin, including inside the mouth -right upper belly pain -unusually weak or tired -yellowing of the eyes or skin Side effects that usually do not require medical attention (report to your doctor or health care professional if they continue or are bothersome): -changes in taste -diarrhea -hair loss -muscle or joint pain -stomach gas -stomach upset This list may not describe all possible side effects. Call your doctor for medical advice about side effects. You may report side effects to FDA at 1-800-FDA-1088. Where should I keep my medicine? Keep out of the reach of children.  Store at room temperature below 25 degrees C (77 degrees F). Protect from light. Throw away any unused medicine after the expiration date. NOTE: This sheet is a summary. It may not cover all possible information. If you have questions about this medicine, talk to your doctor, pharmacist, or health care provider.  2018 Elsevier/Gold Standard (2007-10-21 16:28:07)

## 2017-07-07 NOTE — Progress Notes (Signed)
       Willie RasmussenBrian Edwards is a 46 y.o. male who [facility] today for well adult visit.  Willie Edwards is doing well overall with only minimal issues.  He exercises regularly and stays fit.  He does not smoke or drink excessively.  He does note a small rash on his buttocks that is been present for a month that is mildly itchy.  He is tried some over-the-counter antifungals which have helped only a little.   Past Medical History:  Diagnosis Date  . Allergic rhinitis 07/31/2013   No past surgical history on file. Social History   Tobacco Use  . Smoking status: Never Smoker  . Smokeless tobacco: Never Used  Substance Use Topics  . Alcohol use: No    Alcohol/week: 0.0 oz   family history includes Diabetes in his unknown relative.  ROS as above:  Medications: Current Outpatient Medications  Medication Sig Dispense Refill  . cetirizine (ZYRTEC) 10 MG tablet Take 10 mg by mouth daily.    Marland Kitchen. terbinafine (LAMISIL) 250 MG tablet Take 1 tablet (250 mg total) daily by mouth. 14 tablet 1   No current facility-administered medications for this visit.    No Known Allergies  Health Maintenance Health Maintenance  Topic Date Due  . INFLUENZA VACCINE  03/24/2017  . TETANUS/TDAP  06/10/2026  . HIV Screening  Completed     Exam:  BP 120/83   Pulse 75   Temp 98 F (36.7 C) (Oral)   Ht 5\' 11"  (1.803 m)   Wt 168 lb 1.9 oz (76.3 kg)   BMI 23.45 kg/m  Gen: Well NAD HEENT: EOMI,  MMM Lungs: Normal work of breathing. CTABL Heart: RRR no MRG Abd: NABS, Soft. Nondistended, Nontender Exts: Brisk capillary refill, warm and well perfused.  Skin: Annular erythematous scaly lesion on buttocks present.   No results found for this or any previous visit (from the past 72 hour(s)). No results found.    Assessment and Plan: 46 y.o. male with well adult.  Doing quite well overall.   Willie Edwards is fit and healthy and active.  I do not see any need for repeat labs at this point.  I think is reasonable to check lipids and metabolic panel every several years.  He is not due for any screening tests either as for the buttocks rash I think that is likely tinea corporis and will treat that with oral terbinafine since intermittent topical over-the-counter antifungals have not been effective.  Influenza vaccine given prior to discharge.  Recheck yearly or sooner if needed.   Orders Placed This Encounter  Procedures  . Flu Vaccine QUAD 36+ mos IM (Fluarix & Fluzone Quad PF   Meds ordered this encounter  Medications  . terbinafine (LAMISIL) 250 MG tablet    Sig: Take 1 tablet (250 mg total) daily by mouth.    Dispense:  14 tablet    Refill:  1     Discussed warning signs or symptoms. Please see discharge instructions. Patient expresses understanding.

## 2017-07-22 ENCOUNTER — Telehealth: Payer: Self-pay

## 2017-07-22 NOTE — Telephone Encounter (Signed)
Patient called stated that terbinafine is not working and he needs something else sent to his pharmacy. Please advise. Nickolette Espinola,CMA

## 2017-07-23 MED ORDER — TRIAMCINOLONE ACETONIDE 0.5 % EX OINT: 1.0000 "application " | TOPICAL_OINTMENT | Freq: Two times a day (BID) | CUTANEOUS | 3 refills | Status: DC

## 2017-07-23 NOTE — Telephone Encounter (Signed)
PATIENT ADVISED. Rhonda Cunningham,CMA  

## 2017-07-23 NOTE — Telephone Encounter (Signed)
Triamcinolone cream sent to pharmacy. Return if not better.

## 2018-06-15 ENCOUNTER — Telehealth: Payer: Self-pay | Admitting: Family Medicine

## 2018-06-15 DIAGNOSIS — J309 Allergic rhinitis, unspecified: Secondary | ICD-10-CM

## 2018-06-15 DIAGNOSIS — R21 Rash and other nonspecific skin eruption: Secondary | ICD-10-CM

## 2018-06-15 DIAGNOSIS — Z0000 Encounter for general adult medical examination without abnormal findings: Secondary | ICD-10-CM

## 2018-06-15 NOTE — Telephone Encounter (Signed)
Pt scheduled his cpe on 07/14/18 but would like to come on the 19th and get his fasting labs so please send orders to lab for pt

## 2018-06-15 NOTE — Telephone Encounter (Signed)
Labs ordered prior to visit.  Labs to be done fasting.  Will discuss results on exam.

## 2018-06-16 NOTE — Telephone Encounter (Signed)
Left VM with status update.  

## 2018-07-12 LAB — COMPLETE METABOLIC PANEL WITH GFR
AG Ratio: 2 (calc) (ref 1.0–2.5)
ALBUMIN MSPROF: 4.5 g/dL (ref 3.6–5.1)
ALKALINE PHOSPHATASE (APISO): 41 U/L (ref 40–115)
ALT: 12 U/L (ref 9–46)
AST: 17 U/L (ref 10–40)
BUN: 19 mg/dL (ref 7–25)
CALCIUM: 9.5 mg/dL (ref 8.6–10.3)
CO2: 30 mmol/L (ref 20–32)
Chloride: 103 mmol/L (ref 98–110)
Creat: 0.97 mg/dL (ref 0.60–1.35)
GFR, Est African American: 109 mL/min/{1.73_m2} (ref >=60)
GFR, Est Non African American: 94 mL/min/{1.73_m2} (ref >=60)
Globulin: 2.3 g/dL (calc) (ref 1.9–3.7)
Glucose, Bld: 91 mg/dL (ref 65–99)
Potassium: 4.7 mmol/L (ref 3.5–5.3)
Sodium: 139 mmol/L (ref 135–146)
Total Bilirubin: 0.6 mg/dL (ref 0.2–1.2)
Total Protein: 6.8 g/dL (ref 6.1–8.1)

## 2018-07-12 LAB — CBC
HCT: 44.6 % (ref 38.5–50.0)
HEMOGLOBIN: 14.7 g/dL (ref 13.2–17.1)
MCH: 26.6 pg — ABNORMAL LOW (ref 27.0–33.0)
MCHC: 33 g/dL (ref 32.0–36.0)
MCV: 80.7 fL (ref 80.0–100.0)
MPV: 11.8 fL (ref 7.5–12.5)
Platelets: 184 10*3/uL (ref 140–400)
RBC: 5.53 10*6/uL (ref 4.20–5.80)
RDW: 13.4 % (ref 11.0–15.0)
WBC: 6.3 10*3/uL (ref 3.8–10.8)

## 2018-07-12 LAB — LIPID PANEL W/REFLEX DIRECT LDL
CHOL/HDL RATIO: 2.8 (calc) (ref <5.0)
CHOLESTEROL: 143 mg/dL (ref <200)
HDL: 51 mg/dL (ref >40)
LDL Cholesterol (Calc): 81 mg/dL (calc)
Non-HDL Cholesterol (Calc): 92 mg/dL (calc) (ref <130)
Triglycerides: 37 mg/dL (ref <150)

## 2018-07-14 ENCOUNTER — Encounter: Payer: Self-pay | Admitting: Family Medicine

## 2018-07-14 ENCOUNTER — Ambulatory Visit (INDEPENDENT_AMBULATORY_CARE_PROVIDER_SITE_OTHER): Payer: 59 | Admitting: Family Medicine

## 2018-07-14 VITALS — BP 91/60 | HR 72 | Temp 98.1°F | Ht 69.5 in | Wt 173.7 lb

## 2018-07-14 DIAGNOSIS — L723 Sebaceous cyst: Secondary | ICD-10-CM

## 2018-07-14 DIAGNOSIS — Z0000 Encounter for general adult medical examination without abnormal findings: Secondary | ICD-10-CM | POA: Diagnosis not present

## 2018-07-14 DIAGNOSIS — L578 Other skin changes due to chronic exposure to nonionizing radiation: Secondary | ICD-10-CM | POA: Diagnosis not present

## 2018-07-14 DIAGNOSIS — Z23 Encounter for immunization: Secondary | ICD-10-CM | POA: Diagnosis not present

## 2018-07-14 NOTE — Progress Notes (Signed)
Willie Edwards is a 47 y.o. male who presents to [facility]: Primary [facility] Medicine today for well adult visit. He is concerned about his vision, a bump on his head, and skin spots on his back.  He feels well.  He is normally healthy and exercises regularly.  He eats a careful diet and is happy with how things are going in his life.  Vision: He notes difficulty seeing up close. He wants to know if he needs a referral to an eye doctor.   Bump on head: He has had a small bump on the top of his head for a couple of years. He denies pain or change in size.   Skin: His wife has noted a few new spots on his back. He has gone to a dermatologist in the past, but does not go regularly. He would like to go to a new dermatologist.   Health maintenance: He exercises five days/week. He runs for one hour per day, as well as other exercises. He recently started riding dirt bikes. No family history of colon cancer or colon polyps.    ROS as above:  Past Medical History:  Diagnosis Date  . Allergic rhinitis 07/31/2013   No past surgical history on file. Social History   Tobacco Use  . Smoking status: Never Smoker  . Smokeless tobacco: Never Used  Substance Use Topics  . Alcohol use: No    Alcohol/week: 0.0 standard drinks   family history includes Diabetes in his unknown relative.  Medications: Current Outpatient Medications  Medication Sig Dispense Refill  . cetirizine (ZYRTEC) 10 MG tablet Take 10 mg by mouth daily.    Marland Kitchen triamcinolone ointment (KENALOG) 0.5 % Apply 1 application topically 2 (two) times daily. To affected area, avoid eyes and face 30 g 3   No current facility-administered medications for this visit.    No Known Allergies  Health Maintenance Health Maintenance  Topic Date Due  . TETANUS/TDAP  06/10/2026  . INFLUENZA VACCINE  Completed  . HIV Screening  Completed      Exam:  BP 91/60   Pulse 72   Temp 98.1 F (36.7 C) (Oral)   Ht 5' 9.5" (1.765 m)   Wt 173 lb 11.2 oz (78.8 kg)   BMI 25.28 kg/m  Wt Readings from Last 5 Encounters:  07/14/18 173 lb 11.2 oz (78.8 kg)  07/07/17 168 lb 1.9 oz (76.3 kg)  06/10/16 170 lb (77.1 kg)  11/19/15 179 lb (81.2 kg)  08/14/15 170 lb (77.1 kg)      Gen: Well NAD HEENT: EOMI,  MMM Lungs: Normal work of breathing. CTABL Heart: RRR no MRG Abd: NABS, Soft. Nondistended, Nontender Exts: Brisk capillary refill, warm and well perfused.  Psych: Normal affect Skin:  Round, flat 1 cm brown plaque like oval lesion on left side of middle back. Symmetric, with irregular borders.  Round, flat 1 cm brown and scaly-like oval lesion on lower middle back. Symmetric with irregular borders.   Scalp: Small fleshy less than 5 mm papule left coronal scalp        Depression screen [facility] 2/9 07/14/2018 07/07/2017  Decreased Interest 0 0  Down, Depressed, Hopeless 0 0  PHQ - 2 Score 0 0  Altered sleeping 0 -  Tired, decreased energy 0 -  Change in appetite 0 -  Feeling bad or failure about yourself  0 -  Trouble concentrating 0 -  Moving slowly or fidgety/restless 0 -  Suicidal thoughts 0 -  PHQ-9 Score 0 -  Difficult doing work/chores Not difficult at all -       Lab and Radiology Results Results for orders placed or performed in visit on 06/15/18 (from the past 72 hour(s))  CBC     Status: Abnormal   Collection Time: 07/12/18  8:11 AM  Result Value Ref Range   WBC 6.3 3.8 - 10.8 Thousand/uL   RBC 5.53 4.20 - 5.80 Million/uL   Hemoglobin 14.7 13.2 - 17.1 g/dL   HCT 16.1 09.6 - 04.5 %   MCV 80.7 80.0 - 100.0 fL   MCH 26.6 (L) 27.0 - 33.0 pg   MCHC 33.0 32.0 - 36.0 g/dL   RDW 40.9 81.1 - 91.4 %   Platelets 184 140 - 400 Thousand/uL   MPV 11.8 7.5 - 12.5 fL  COMPLETE METABOLIC PANEL WITH GFR     Status: None   Collection Time: 07/12/18  8:11 AM  Result Value Ref Range   Glucose, Bld 91 65 - 99  mg/dL    Comment: .            Fasting reference interval .    BUN 19 7 - 25 mg/dL   Creat 7.82 9.56 - 2.13 mg/dL   GFR, Est Non African American 94 > OR = 60 mL/min/1.43m2   GFR, Est African American 109 > OR = 60 mL/min/1.52m2   BUN/Creatinine Ratio NOT APPLICABLE 6 - 22 (calc)   Sodium 139 135 - 146 mmol/L   Potassium 4.7 3.5 - 5.3 mmol/L   Chloride 103 98 - 110 mmol/L   CO2 30 20 - 32 mmol/L   Calcium 9.5 8.6 - 10.3 mg/dL   Total Protein 6.8 6.1 - 8.1 g/dL   Albumin 4.5 3.6 - 5.1 g/dL   Globulin 2.3 1.9 - 3.7 g/dL (calc)   AG Ratio 2.0 1.0 - 2.5 (calc)   Total Bilirubin 0.6 0.2 - 1.2 mg/dL   Alkaline phosphatase (APISO) 41 40 - 115 U/L   AST 17 10 - 40 U/L   ALT 12 9 - 46 U/L  Lipid Panel w/reflex Direct LDL     Status: None   Collection Time: 07/12/18  8:11 AM  Result Value Ref Range   Cholesterol 143 <200 mg/dL   HDL 51 >08 mg/dL   Triglycerides 37 <657 mg/dL   LDL Cholesterol (Calc) 81 mg/dL (calc)    Comment: Reference range: <100 . Desirable range <100 mg/dL for primary prevention;   <70 mg/dL for patients with CHD or diabetic patients  with > or = 2 CHD risk factors. Marland Kitchen LDL-C is now calculated using the Martin-Hopkins  calculation, which is a validated novel method providing  better accuracy than the Friedewald equation in the  estimation of LDL-C.  Horald Pollen et al. Lenox Ahr. 8469;629(52): 2061-2068  (http://education.[employer].com/faq/FAQ164)    Total CHOL/HDL Ratio 2.8 <5.0 (calc)   Non-HDL Cholesterol (Calc) 92 <841 mg/dL (calc)    Comment: For patients with diabetes plus 1 major ASCVD risk  factor, treating to a non-HDL-C goal of <100 mg/dL  (LDL-C of <32 mg/dL) is considered a therapeutic  option.       Assessment and Plan: 47 y.o. male with   Adult.  Doing well.  No severe concerns today.  Vision is finally healthy with low medical risks.  Vision changes: Presbyopia discussed going to see an optometrist. Does not need referral.   Bump on  scalp: Likely sebaceous cyst. No concerns and no  need for removal.  Skin: Likely seborrheic keratosis on his back. Referral to dermatology for yearly skin check at dermatology.  Health maintenance: Discussed safety when dirt biking. He did get a flu shot today. Plan for colonoscopy at age 47.    Orders Placed This Encounter  Procedures  . Flu Vaccine QUAD 6+ mos PF IM (Fluarix Quad PF)  . Ambulatory referral to Dermatology    Referral Priority:   Routine    Referral Type:   Consultation    Referral Reason:   Specialty Services Required    Requested Specialty:   Dermatology    Number of Visits Requested:   1     Discussed warning signs or symptoms. Please see discharge instructions. Patient expresses understanding.  I personally was present and performed or re-performed the history, physical exam and medical decision-making activities of this service and have verified that the service and findings are accurately documented in the student's note. ___________________________________________ Clementeen GrahamEvan Nikkolas Coomes M.D., ABFM., CAQSM. Primary Care and Sports Medicine Adjunct Instructor of Family Medicine  [employer]

## 2018-07-14 NOTE — Patient Instructions (Addendum)
Thank you for coming in today. You are doing well.  Recheck yearly if all is well.  Continue healthy lifestyle.   You should hear from Dermatology in 1 week.  Let me know if you do not.      The 10-year ASCVD risk score Denman George(Goff DC Montez HagemanJr., et al., 2013) is: 0.6%   Values used to calculate the score:     Age: [age] years     Sex: Male     Is Non-Hispanic African American: No     Diabetic: No     Tobacco smoker: No     Systolic Blood Pressure: 91 mmHg     Is BP treated: No     HDL Cholesterol: 51 mg/dL     Total Cholesterol: 143 mg/dL

## 2018-09-21 ENCOUNTER — Encounter: Payer: Self-pay | Admitting: Family Medicine

## 2018-09-21 ENCOUNTER — Ambulatory Visit (INDEPENDENT_AMBULATORY_CARE_PROVIDER_SITE_OTHER): Payer: 59 | Admitting: Family Medicine

## 2018-09-21 VITALS — BP 118/81 | HR 68 | Ht 69.0 in | Wt 174.0 lb

## 2018-09-21 DIAGNOSIS — M7712 Lateral epicondylitis, left elbow: Secondary | ICD-10-CM | POA: Diagnosis not present

## 2018-09-21 MED ORDER — NITROGLYCERIN 0.2 MG/HR TD PT24: MEDICATED_PATCH | TRANSDERMAL | 1 refills | Status: DC

## 2018-09-21 NOTE — Patient Instructions (Signed)
Thank you for coming in today. Use the counter force brace.  Do the stretching.  Keep the elbow straight and pull and hand and wrist down.  Use the resistance exercises.  Allow the hand to go from the up position to the down position slowly resistance and downward force.  Make sure to keep the elbow straight during the exercise.   Nitroglycerin Protocol   Apply 1/4 nitroglycerin patch to affected area daily.  Change position of patch within the affected area every 24 hours.  You may experience a headache during the first 1-2 weeks of using the patch, these should subside.  If you experience headaches after beginning nitroglycerin patch treatment, you may take your preferred over the counter pain reliever.  Another side effect of the nitroglycerin patch is skin irritation or rash related to patch adhesive.  Please notify our office if you develop more severe headaches or rash, and stop the patch.  Tendon healing with nitroglycerin patch may require 12 to 24 weeks depending on the extent of injury.  Men should not use if taking Viagra, Cialis, or Levitra.   Do not use if you have migraines or rosacea.

## 2018-09-22 DIAGNOSIS — M7712 Lateral epicondylitis, left elbow: Secondary | ICD-10-CM | POA: Insufficient documentation

## 2018-09-22 NOTE — Progress Notes (Signed)
Willie Edwards is a 48 y.o. male who presents to [facility] today for left lateral elbow pain.  Willie Edwards has a 1 month history of pain in the left lateral elbow.  He has been doing a lot of dirt bike riding and notes that he actually eats the clutch using his left hand and thinks that may be a cause of his pain.  He denies any injury history.  He denies fevers chills nausea vomiting or diarrhea.  He is tried Tylenol ibuprofen and diclofenac gel which has not helped much.  Is not really tried any dedicated exercises yet.  No radiating pain weakness or numbness distally.    ROS:  As above  Exam:  BP 118/81   Pulse 68   Ht 5\' 9"  (1.753 m)   Wt 174 lb (78.9 kg)   BMI 25.70 kg/m  Wt Readings from Last 5 Encounters:  09/21/18 174 lb (78.9 kg)  07/14/18 173 lb 11.2 oz (78.8 kg)  07/07/17 168 lb 1.9 oz (76.3 kg)  06/10/16 170 lb (77.1 kg)  11/19/15 179 lb (81.2 kg)   General: Well Developed, well nourished, and in no acute distress.  Neuro/Psych: Alert and oriented x3, extra-ocular muscles intact, able to move all 4 extremities, sensation grossly intact. Skin: Warm and dry, no rashes noted.  Respiratory: Not using accessory muscles, speaking in full sentences, trachea midline.  Cardiovascular: Pulses palpable, no extremity edema. Abdomen: Does not appear distended. MSK: Left elbow normal-appearing normal motion.  Tender palpation lateral epicondyle. Pain with resisted wrist extension. Strength intact throughout. Pulses cap refill and sensation are intact upper extremities bilaterally.     Assessment and Plan: 48 y.o. male with left elbow pain very likely lateral epicondylitis.  Radial tunnel syndrome could also be a factor.  Plan for treatment with eccentric exercises and stretching counterforce brace.  Additionally use nitroglycerin patch to call.  Recheck in a few weeks or month or so if not improving.  Modulate activities as  tolerated. Next step would likely be ultrasound or injection.  Consider PRP in the future if not improving.   PDMP not reviewed this encounter. No orders of the defined types were placed in this encounter.  Meds ordered this encounter  Medications  . nitroGLYCERIN (NITRODUR - DOSED IN MG/24 HR) 0.2 mg/hr patch    Sig: 1/4 patch to the elbow for tendonitis daily    Dispense:  30 patch    Refill:  1    Historical information moved to improve visibility of documentation.  Past Medical History:  Diagnosis Date  . Allergic rhinitis 07/31/2013   No past surgical history on file. Social History   Tobacco Use  . Smoking status: Never Smoker  . Smokeless tobacco: Never Used  Substance Use Topics  . Alcohol use: No    Alcohol/week: 0.0 standard drinks   family history includes Diabetes in his unknown relative.  Medications: Current Outpatient Medications  Medication Sig Dispense Refill  . cetirizine (ZYRTEC) 10 MG tablet Take 10 mg by mouth daily.    Marland Kitchen triamcinolone ointment (KENALOG) 0.5 % Apply 1 application topically 2 (two) times daily. To affected area, avoid eyes and face 30 g 3  . nitroGLYCERIN (NITRODUR - DOSED IN MG/24 HR) 0.2 mg/hr patch 1/4 patch to the elbow for tendonitis daily 30 patch 1   No current facility-administered medications for this visit.    No Known Allergies    Discussed warning signs or symptoms. Please see discharge instructions.  Patient expresses understanding.

## 2019-03-30 ENCOUNTER — Other Ambulatory Visit: Payer: Self-pay | Admitting: Family Medicine

## 2020-01-18 ENCOUNTER — Ambulatory Visit: Payer: 59 | Attending: Internal Medicine

## 2020-01-18 DIAGNOSIS — Z23 Encounter for immunization: Secondary | ICD-10-CM

## 2020-01-18 NOTE — Progress Notes (Signed)
   Covid-19 Vaccination Clinic  Name:  Willie Edwards    MRN: [id number] DOB: 06-21-1971  01/18/2020  Mr. [name] was observed post Covid-19 immunization for 15 minutes without incident. He was provided with Vaccine Information Sheet and instruction to access the V-Safe system.   Mr. [name] was instructed to call 911 with any severe reactions post vaccine: Marland Kitchen Difficulty breathing  . Swelling of face and throat  . A fast heartbeat  . A bad rash all over body  . Dizziness and weakness   Immunizations Administered    Name Date Dose VIS Date Route   JANSSEN COVID-19 VACCINE 01/18/2020 11:39 AM 0.5 mL 10/21/2019 Intramuscular   Manufacturer: [employer]   Lot: [id number]   NDC: 25189-842-10

## 2021-08-02 ENCOUNTER — Telehealth: Payer: 59 | Admitting: Emergency Medicine

## 2021-08-02 DIAGNOSIS — U071 COVID-19: Secondary | ICD-10-CM

## 2021-08-02 MED ORDER — MOLNUPIRAVIR EUA 200MG CAPSULE: 4.0000 | ORAL_CAPSULE | Freq: Two times a day (BID) | ORAL | 0 refills | Status: AC

## 2021-08-02 NOTE — Progress Notes (Signed)
Virtual Visit Consent   Willie Edwards, you are scheduled for a virtual visit with a [place] provider today.     Just as with appointments in the office, your consent must be obtained to participate.  Your consent will be active for this visit and any virtual visit you may have with one of our providers in the next 365 days.     If you have a MyChart account, a copy of this consent can be sent to you electronically.  All virtual visits are billed to your insurance company just like a traditional visit in the office.    As this is a virtual visit, video technology does not allow for your provider to perform a traditional examination.  This may limit your provider's ability to fully assess your condition.  If your provider identifies any concerns that need to be evaluated in person or the need to arrange testing (such as labs, EKG, etc.), we will make arrangements to do so.     Although advances in technology are sophisticated, we cannot ensure that it will always work on either your end or our end.  If the connection with a video visit is poor, the visit may have to be switched to a telephone visit.  With either a video or telephone visit, we are not always able to ensure that we have a secure connection.     I need to obtain your verbal consent now.   Are you willing to proceed with your visit today? Yes   [name] has provided verbal consent on 08/02/2021 for a virtual visit (video or telephone).   Willie Edwards, [place]   Date: 08/02/2021 11:48 AM   Virtual Visit via Video Note   I, Willie Edwards, connected with  Salem Lembke  ([id number], 19-Mar-1971) on 08/02/21 at 11:45 AM EST by a video-enabled telemedicine application and verified that I am speaking with the correct person using two identifiers.  Location: Patient: Virtual Visit Location Patient: Home Provider: Virtual Visit Location Provider: Home Office   I discussed the limitations of evaluation and management by telemedicine  and the availability of in person appointments. The patient expressed understanding and agreed to proceed.    History of Present Illness: Willie Edwards is a 50 y.o. who identifies as a male who was assigned male at birth, and is being seen today for sinus pressure, congestion, loss of taste and smell x 1 day.  Denies sick exposure to COVID, flu or strep.  Has tried OTC medications without relief.  Denies aggravating factors.  Denies previous symptoms in the past with covid.  Denies fever, chills, fatigue, rhinorrhea, sore throat, SOB, wheezing, chest pain, nausea, changes in bowel or bladder habits.    ROS: As per HPI.  All other pertinent ROS negative.     HPI: HPI  Problems:  Patient Active Problem List   Diagnosis Date Noted   Lateral epicondylitis of left elbow 09/22/2018   Sebaceous cyst 07/14/2018   Allergic rhinitis 07/31/2013    Allergies: No Known Allergies Medications:  Current Outpatient Medications:    molnupiravir EUA (LAGEVRIO) 200 mg CAPS capsule, Take 4 capsules (800 mg total) by mouth 2 (two) times daily for 5 days., Disp: 40 capsule, Rfl: 0   cetirizine (ZYRTEC) 10 MG tablet, Take 10 mg by mouth daily., Disp: , Rfl:   Observations/Objective: Patient is well-developed, well-nourished in no acute distress.  Resting comfortably at home. Appears fatigue, but nontoxic Head is normocephalic, atraumatic.  No labored breathing. Speaking  in full sentences and tolerating own secretions Speech is clear and coherent with logical content.  Patient is alert and oriented at baseline.   Assessment and Plan: 1. COVID-19 virus infection  COVID test was positive You should remain isolated in your home for 5 days from symptom onset AND greater than 72 hours after symptoms resolution (absence of fever without the use of fever-reducing medication and improvement in respiratory symptoms), whichever is longer Get plenty of rest and push fluids Molnupiravir prescribed.  Take as directed and  to completion Use zyrtec for nasal congestion, runny nose, and/or sore throat Use flonase for nasal congestion and runny nose Use medications daily for symptom relief Use OTC medications like ibuprofen or tylenol as needed fever or pain Follow up with PCP in 1-2 days via phone or e-visit for recheck and to ensure symptoms are improving Call or go to the ED if you have any new or worsening symptoms such as fever, worsening cough, shortness of breath, chest tightness, chest pain, turning blue, changes in mental status, etc...    Follow Up Instructions: I discussed the assessment and treatment plan with the patient. The patient was provided an opportunity to ask questions and all were answered. The patient agreed with the plan and demonstrated an understanding of the instructions.  A copy of instructions were sent to the patient via MyChart unless otherwise noted below.   The patient was advised to call back or seek an in-person evaluation if the symptoms worsen or if the condition fails to improve as anticipated.  Time:  I spent 10 minutes with the patient via telehealth technology discussing the above problems/concerns.    Willie Harding, PA-C

## 2021-08-02 NOTE — Patient Instructions (Signed)
  Willie Edwards, thank you for joining [name], PA-C for today's virtual visit.  While this provider is not your primary care provider (PCP), if your PCP is located in our provider database this encounter information will be shared with them immediately following your visit.  Consent: (Patient) Willie Edwards provided verbal consent for this virtual visit at the beginning of the encounter.  Current Medications:  Current Outpatient Medications:    molnupiravir EUA (LAGEVRIO) 200 mg CAPS capsule, Take 4 capsules (800 mg total) by mouth 2 (two) times daily for 5 days., Disp: 40 capsule, Rfl: 0   cetirizine (ZYRTEC) 10 MG tablet, Take 10 mg by mouth daily., Disp: , Rfl:    Medications ordered in this encounter:  Meds ordered this encounter  Medications   molnupiravir EUA (LAGEVRIO) 200 mg CAPS capsule    Sig: Take 4 capsules (800 mg total) by mouth 2 (two) times daily for 5 days.    Dispense:  40 capsule    Refill:  0    Order Specific Question:   Supervising Provider    Answer:   Hyacinth Meeker, [place] [[id number]]     *If you need refills on other medications prior to your next appointment, please contact your pharmacy*  Follow-Up: Call back or seek an in-person evaluation if the symptoms worsen or if the condition fails to improve as anticipated.  Other Instructions COVID test was positive You should remain isolated in your home for 5 days from symptom onset AND greater than 72 hours after symptoms resolution (absence of fever without the use of fever-reducing medication and improvement in respiratory symptoms), whichever is longer Get plenty of rest and push fluids Molnupiravir prescribed.  Take as directed and to completion Use zyrtec for nasal congestion, runny nose, and/or sore throat Use flonase for nasal congestion and runny nose Use medications daily for symptom relief Use OTC medications like ibuprofen or tylenol as needed fever or pain Follow up with PCP in 1-2 days via phone or  e-visit for recheck and to ensure symptoms are improving Call or go to the ED if you have any new or worsening symptoms such as fever, worsening cough, shortness of breath, chest tightness, chest pain, turning blue, changes in mental status, etc...     If you have been instructed to have an in-person evaluation today at a local Urgent Care facility, please use the link below. It will take you to a list of all of our available [facility], including address, phone number and hours of operation. Please do not delay care.  [facility]  If you or a family member do not have a primary care provider, use the link below to schedule a visit and establish care. When you choose a [facility] primary care physician or advanced practice provider, you gain a long-term partner in health. Find a Primary Care Provider  Learn more about [facility]'s in-office and virtual care options: [facility] - Get Care Now

## 2021-09-04 ENCOUNTER — Telehealth: Payer: BC Managed Care – PPO | Admitting: Physician Assistant

## 2021-09-04 ENCOUNTER — Telehealth: Payer: Self-pay

## 2021-09-04 DIAGNOSIS — F419 Anxiety disorder, unspecified: Secondary | ICD-10-CM | POA: Diagnosis not present

## 2021-09-04 MED ORDER — HYDROXYZINE PAMOATE 25 MG PO CAPS: 25.0000 mg | ORAL_CAPSULE | Freq: Three times a day (TID) | ORAL | 0 refills | Status: DC | PRN

## 2021-09-04 NOTE — Progress Notes (Signed)
Virtual Visit Consent   Willie Edwards, you are scheduled for a virtual visit with a [facility] provider today.     Just as with appointments in the office, your consent must be obtained to participate.  Your consent will be active for this visit and any virtual visit you may have with one of our providers in the next 365 days.     If you have a MyChart account, a copy of this consent can be sent to you electronically.  All virtual visits are billed to your insurance company just like a traditional visit in the office.    As this is a virtual visit, video technology does not allow for your provider to perform a traditional examination.  This may limit your provider's ability to fully assess your condition.  If your provider identifies any concerns that need to be evaluated in person or the need to arrange testing (such as labs, EKG, etc.), we will make arrangements to do so.     Although advances in technology are sophisticated, we cannot ensure that it will always work on either your end or our end.  If the connection with a video visit is poor, the visit may have to be switched to a telephone visit.  With either a video or telephone visit, we are not always able to ensure that we have a secure connection.     I need to obtain your verbal consent now.   Are you willing to proceed with your visit today?    Willie Edwards has provided verbal consent on 09/04/2021 for a virtual visit (video or telephone).   Mar Daring, PA-C   Date: 09/04/2021 1:21 PM   Virtual Visit via Video Note   IMar Daring, connected with  Willie Edwards  ([id number], 1971/02/03) on 09/04/21 at  1:00 PM EST by a video-enabled telemedicine application and verified that I am speaking with the correct person using two identifiers.  Location: Patient: Virtual Visit Location Patient: Home Provider: Virtual Visit Location Provider: Home Office   I discussed the limitations of evaluation and management by  telemedicine and the availability of in person appointments. The patient expressed understanding and agreed to proceed.    History of Present Illness: Willie Edwards is a 51 y.o. who identifies as a male who was assigned male at birth, and is being seen today for increased stress and anxiety associated with work. He is a Landscape architect for [employer]. He reports he has been with this position since October 2022 and has been working over 50 hour weeks. Reports this job is very stressful and busy. He feels he is having increased anxiety, shaking hands, palpitations, headaches, all associated with work. Denies SI or HI.   Problems:  Patient Active Problem List   Diagnosis Date Noted   Lateral epicondylitis of left elbow 09/22/2018   Sebaceous cyst 07/14/2018   Allergic rhinitis 07/31/2013    Allergies: No Known Allergies Medications:  Current Outpatient Medications:    hydrOXYzine (VISTARIL) 25 MG capsule, Take 1 capsule (25 mg total) by mouth every 8 (eight) hours as needed., Disp: 30 capsule, Rfl: 0   cetirizine (ZYRTEC) 10 MG tablet, Take 10 mg by mouth daily., Disp: , Rfl:   Observations/Objective: Patient is well-developed, well-nourished in no acute distress.  Resting comfortably at home.  Head is normocephalic, atraumatic.  No labored breathing.  Speech is clear and coherent with logical content.  Patient is alert and oriented at baseline.    Assessment  and Plan: 1. Acute anxiety - hydrOXYzine (VISTARIL) 25 MG capsule; Take 1 capsule (25 mg total) by mouth every 8 (eight) hours as needed.  Dispense: 30 capsule; Refill: 0  - Situational anxiety associated with work - Will trial hydroxyzine for as needed panic/anxiety attacks - Discussed EAP and/or other mental health resources (sent via AVS) - Will schedule f/u with PCP  Follow Up Instructions: I discussed the assessment and treatment plan with the patient. The patient was provided an opportunity to ask questions and all were  answered. The patient agreed with the plan and demonstrated an understanding of the instructions.  A copy of instructions were sent to the patient via MyChart unless otherwise noted below.   The patient was advised to call back or seek an in-person evaluation if the symptoms worsen or if the condition fails to improve as anticipated.  Time:  I spent 15 minutes with the patient via telehealth technology discussing the above problems/concerns.    Mar Daring, PA-C

## 2021-09-04 NOTE — Patient Instructions (Signed)
°  [name], thank you for joining Margaretann Loveless, PA-C for today's virtual visit.  While this provider is not your primary care provider (PCP), if your PCP is located in our provider database this encounter information will be shared with them immediately following your visit.  Consent: (Patient) Willie Edwards provided verbal consent for this virtual visit at the beginning of the encounter.  Current Medications:  Current Outpatient Medications:    hydrOXYzine (VISTARIL) 25 MG capsule, Take 1 capsule (25 mg total) by mouth every 8 (eight) hours as needed., Disp: 30 capsule, Rfl: 0   cetirizine (ZYRTEC) 10 MG tablet, Take 10 mg by mouth daily., Disp: , Rfl:    Medications ordered in this encounter:  Meds ordered this encounter  Medications   hydrOXYzine (VISTARIL) 25 MG capsule    Sig: Take 1 capsule (25 mg total) by mouth every 8 (eight) hours as needed.    Dispense:  30 capsule    Refill:  0    Order Specific Question:   Supervising Provider    Answer:   Hyacinth Meeker, [place] [[id number]]     *If you need refills on other medications prior to your next appointment, please contact your pharmacy*  Follow-Up: Call back or seek an in-person evaluation if the symptoms worsen or if the condition fails to improve as anticipated.  Other Instructions  Mindfulness Training/Deep Breathing/Guided Meditations: - Consider downloading one of the following apps as they each deal with walking you through these techniques -- Calm, HeadSpace, [place] - There are also a lot of great books on these subjects. Here are a few good ones to consider:           * Mindfulness for Beginners by Charlene Brooke           * Practicing Mindfulness by Trisha Mangle           * Breath WORK by Gene Smithson   You can also consider scheduling an assessment with a counselor for more personalized sessions/therapies. Here are some good local options:           [facility] - [phone]           *  [facility] - [phone]           [facility] - [phone]   Take Care!    If you have been instructed to have an in-person evaluation today at a local Urgent Care facility, please use the link below. It will take you to a list of all of our available [facility], including address, phone number and hours of operation. Please do not delay care.  [facility]  If you or a family member do not have a primary care provider, use the link below to schedule a visit and establish care. When you choose a [facility] primary care physician or advanced practice provider, you gain a long-term partner in health. Find a Primary Care Provider  Learn more about [facility]'s in-office and virtual care options: [facility] - Get Care Now

## 2021-09-04 NOTE — Telephone Encounter (Signed)
Received request for behavioral intervention for patient. I called and spoke with Willie Edwards. Advised him of these walk in behavioral health locations:   [facility] [address] [place], [place] [address], [phone]  [facility] [address] [place], [place] [address], [phone]  [facility], [address], [place], [place] [address], [phone]  The National Suicide Hotline # 1-800-TALK was provided as well. Patient expressed understanding and will go to [facility] in [place].  He has our direct # here as well.

## 2021-10-12 NOTE — Progress Notes (Signed)
New Patient Office Visit  Subjective:  Patient ID: Willie Edwards, male    DOB: 21-Sep-1970  Age: 51 y.o. MRN: [id number]  CC:  Chief Complaint  Patient presents with   Transitions Of Care     HPI Hays Granucci presents to establish care.   Situational anxiety-taking hydroxyzine 25 mg on an as-needed basis for significant anxiety.  Most of his anxiety revolves around his work.  Notes that he will be changing his employment soon and is currently taking paid time off.  Has not been to work in over a week and notes that his anxiety has completely resolved.  Races dirt bikes and notes this helps tremendously with his anxiety management.  Denies SI/HI.  Sebaceous cyst-has had a sebaceous cyst on the top of his head this been present for several years.  This is not painful, tender, or swollen.  No enlargement of the cyst.  Reports his wife wanted to have it checked to make sure it is okay.  States that the cyst is not bothersome.  Thinks he has had some hearing loss that he would like to have evaluated.  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  He did have ear protection but at times had to remove this depending on the situation.  Past Medical History:  Diagnosis Date   Allergic rhinitis 07/31/2013    History reviewed. No pertinent surgical history.  Family History  Problem Relation Age of Onset   Diabetes Other        grandmother     Social History   Socioeconomic History   Marital status: Married    Spouse name: Not on file   Number of children: Not on file   Years of education: Not on file   Highest education level: Not on file  Occupational History   Not on file  Tobacco Use   Smoking status: Never   Smokeless tobacco: Never  Substance and Sexual Activity   Alcohol use: No    Alcohol/week: 0.0 standard drinks   Drug use: No   Sexual activity: Yes    Partners: Female  Other Topics Concern    Not on file  Social History Narrative   Not on file   Social Determinants of Health   Financial Resource Strain: Not on file  Food Insecurity: Not on file  Transportation Needs: Not on file  Physical Activity: Not on file  Stress: Not on file  Social Connections: Not on file  Intimate Partner Violence: Not on file    ROS Review of Systems  Constitutional:  Negative for chills, fatigue, fever and unexpected weight change.  HENT:  Positive for hearing loss (Left ear cerumen impaction). Negative for congestion, rhinorrhea, sinus pressure and sore throat.   Eyes:  Negative for visual disturbance.  Respiratory:  Negative for cough, chest tightness, shortness of breath and wheezing.   Cardiovascular:  Negative for chest pain, palpitations and leg swelling.  Gastrointestinal:  Negative for abdominal pain, constipation, diarrhea, nausea and vomiting.  Genitourinary:  Negative for dysuria, frequency and urgency.  Skin:  Negative for rash.  Neurological:  Negative for dizziness, light-headedness and headaches.  Psychiatric/Behavioral:  Negative for dysphoric mood, self-injury, sleep disturbance and suicidal ideas. The patient is nervous/anxious.    Objective:   Today's Vitals: BP 125/68 (BP Location: Left Arm, Patient Position: Sitting, Cuff Size: Normal)    Pulse 68    Temp  98.1 F (36.7 C)    Ht 5' 9.5" (1.765 m)    Wt 190 lb 14.4 oz (86.6 kg)    SpO2 100%    BMI 27.79 kg/m   Physical Exam Vitals and nursing note reviewed.  Constitutional:      General: He is not in acute distress.    Appearance: Normal appearance.  HENT:     Head: Normocephalic and atraumatic.     Right Ear: Tympanic membrane, ear canal and external ear normal. There is no impacted cerumen.     Left Ear: External ear normal. There is impacted cerumen.  Cardiovascular:     Rate and Rhythm: Normal rate and regular rhythm.     Pulses: Normal pulses.     Heart sounds: Normal heart sounds. No murmur heard.   No  friction rub. No gallop.  Pulmonary:     Effort: Pulmonary effort is normal. No respiratory distress.     Breath sounds: Normal breath sounds.  Skin:    General: Skin is warm and dry.  Neurological:     Mental Status: He is alert and oriented to person, place, and time.  Psychiatric:        Mood and Affect: Mood normal.        Behavior: Behavior normal.        Thought Content: Thought content normal.        Judgment: Judgment normal.    Assessment & Plan:   1. Encounter to establish care Reviewed available information and discussed care concerns with patient.   2. Situational anxiety Okay to use hydroxyzine 25 mg as prescribed on an as-needed basis.  Since his job is changing, his anxiety levels have already decreased.  Discussed using counseling as well as medication but he would like to defer these options for now.  3. Sebaceous cyst No enlargement, tenderness, redness, or concerning findings on exam.  Since this is not bothersome, would leave this alone for now.  If it becomes bothersome or he he would like to have it removed for cosmetic reasons, he is welcome to return for sebaceous cyst removal.  4. Impacted cerumen of left ear Indication: Cerumen impaction of the ear(s) Medical necessity statement: On physical examination, cerumen impairs clinically significant portions of the external auditory canal, and tympanic membrane. Noted obstructive, copious cerumen that cannot be removed without magnification and instrumentations  Consent: Discussed benefits and risks of procedure and verbal consent obtained Procedure: Patient was prepped for the procedure. Utilized an otoscope to assess and take note of the ear canal, the tympanic membrane, and the presence, amount, and placement of the cerumen. Gentle water irrigation and soft plastic curette was utilized to remove cerumen.  Post procedure examination: shows cerumen was completely removed. Patient tolerated procedure well. The  patient is made aware that they may experience temporary vertigo, temporary hearing loss, and temporary discomfort. If these symptom last for more than 24 hours to call the clinic or proceed to the ED.  5. Hearing loss, unspecified hearing loss type, unspecified laterality Offered referral to audiology.  He would like to defer today and states intention to go to [facility] to have this done.  If he changes his mind, he can always let me know and I will be glad to put in a referral.  Outpatient Encounter Medications as of 10/13/2021  Medication Sig   cetirizine (ZYRTEC) 10 MG tablet Take 10 mg by mouth daily.   hydrOXYzine (VISTARIL) 25 MG capsule Take 1 capsule (25 mg  total) by mouth every 8 (eight) hours as needed.   No facility-administered encounter medications on file as of 10/13/2021.    Follow-up: Return for annual physical exam at your convenience.   Clearnce Sorrel, DNP, APRN, FNP-BC [facility]

## 2021-10-13 ENCOUNTER — Encounter: Payer: Self-pay | Admitting: Medical-Surgical

## 2021-10-13 ENCOUNTER — Ambulatory Visit (INDEPENDENT_AMBULATORY_CARE_PROVIDER_SITE_OTHER): Payer: BC Managed Care – PPO | Admitting: Medical-Surgical

## 2021-10-13 ENCOUNTER — Other Ambulatory Visit: Payer: Self-pay

## 2021-10-13 VITALS — BP 125/68 | HR 68 | Temp 98.1°F | Ht 69.5 in | Wt 190.9 lb

## 2021-10-13 DIAGNOSIS — H6122 Impacted cerumen, left ear: Secondary | ICD-10-CM

## 2021-10-13 DIAGNOSIS — H9190 Unspecified hearing loss, unspecified ear: Secondary | ICD-10-CM

## 2021-10-13 DIAGNOSIS — L723 Sebaceous cyst: Secondary | ICD-10-CM

## 2021-10-13 DIAGNOSIS — F418 Other specified anxiety disorders: Secondary | ICD-10-CM

## 2021-10-13 DIAGNOSIS — Z7689 Persons encountering health services in other specified circumstances: Secondary | ICD-10-CM

## 2022-06-05 ENCOUNTER — Telehealth: Payer: BC Managed Care – PPO | Admitting: Physician Assistant

## 2022-06-05 DIAGNOSIS — M545 Low back pain, unspecified: Secondary | ICD-10-CM | POA: Diagnosis not present

## 2022-06-05 MED ORDER — NAPROXEN 500 MG PO TABS: 500.0000 mg | ORAL_TABLET | Freq: Two times a day (BID) | ORAL | 0 refills | Status: DC

## 2022-06-05 MED ORDER — CYCLOBENZAPRINE HCL 10 MG PO TABS: 5.0000 mg | ORAL_TABLET | Freq: Three times a day (TID) | ORAL | 0 refills | Status: DC | PRN

## 2022-06-05 NOTE — Progress Notes (Signed)

## 2023-02-05 ENCOUNTER — Encounter: Payer: Self-pay | Admitting: Physician Assistant

## 2023-02-05 ENCOUNTER — Ambulatory Visit (INDEPENDENT_AMBULATORY_CARE_PROVIDER_SITE_OTHER): Payer: BC Managed Care – PPO | Admitting: Physician Assistant

## 2023-02-05 VITALS — BP 112/66 | HR 70 | Ht 69.5 in | Wt 197.0 lb

## 2023-02-05 DIAGNOSIS — Z1211 Encounter for screening for malignant neoplasm of colon: Secondary | ICD-10-CM | POA: Diagnosis not present

## 2023-02-05 DIAGNOSIS — R21 Rash and other nonspecific skin eruption: Secondary | ICD-10-CM | POA: Insufficient documentation

## 2023-02-05 MED ORDER — CLOTRIMAZOLE-BETAMETHASONE 1-0.05 % EX CREA: 1.0000 | TOPICAL_CREAM | Freq: Two times a day (BID) | CUTANEOUS | 0 refills | Status: AC

## 2023-02-05 NOTE — Progress Notes (Unsigned)
   Acute Office Visit  Subjective:     Patient ID: Willie Edwards, male    DOB: 1971-06-03, 52 y.o.   MRN: [id number]  Chief Complaint  Patient presents with   Rash    Patient presents with rash over right popliteal fossa that has been worsening over the last 5 days. Patient says the pain is a dull, heat ache and it is extremely itchy. It has not spread anywhere else on the body. He tried [facility] brand OTC anti-itch cream which seemed to make it worse. Patient states it could possibly be poison ivy as it appeared after dirt-biking. He denies, fever, n/v, numbness, tingling, discharge, and radiation.    Review of Systems  Constitutional:  Negative for chills and fever.  Respiratory:  Negative for shortness of breath.   Cardiovascular:  Negative for chest pain.  Gastrointestinal:  Negative for nausea and vomiting.  Musculoskeletal:  Negative for joint pain and myalgias.  Skin:  Positive for itching and rash.  Neurological:  Negative for tingling, sensory change and weakness.       Objective:    BP 112/66 (BP Location: Left Arm, Patient Position: Sitting, Cuff Size: Normal)   Pulse 70   Ht 5' 9.5" (1.765 m)   Wt 89.4 kg   SpO2 98%   BMI 28.67 kg/m  {Vitals History (Optional):23777}  Physical Exam Constitutional:      Appearance: Normal appearance.  Musculoskeletal:        General: No swelling, tenderness, deformity or signs of injury.  Skin:    Findings: Erythema and rash present.     Comments: Erythematous, raised, and scaling rash extending across the R popliteal fossa and 2-3in below and above it in a rounded shape. There is no warmth, discharge, satellite lesions, vesicles, or blistering.  Neurological:     General: No focal deficit present.     Mental Status: He is alert and oriented to person, place, and time.     No results found for any visits on 02/05/23.      Assessment & Plan:   Problem List Items Addressed This Visit   None Visit Diagnoses     Encounter  for colorectal cancer screening    -  Primary   Relevant Orders   Ambulatory referral to Gastroenterology   Rash       Relevant Orders   Fungal stain       Meds ordered this encounter  Medications   clotrimazole-betamethasone (LOTRISONE) cream    Sig: Apply 1 Application topically 2 (two) times daily.    Dispense:  45 g    Refill:  0    Order Specific Question:   Supervising Provider    Answer:   Nani Gasser D [[id number]]   Skin scraping for KOH completed to identify culprit Treated for both fungal infection and contact dermatitis with poison plant. Will cater treatment once scraping has resulted.  No follow-ups on file.  [employer], [address]

## 2023-02-08 ENCOUNTER — Encounter: Payer: Self-pay | Admitting: Physician Assistant

## 2023-02-11 LAB — FUNGAL STAIN
FUNGAL SMEAR:: NONE SEEN
MICRO NUMBER:: 15096580
SPECIMEN QUALITY:: ADEQUATE

## 2023-02-12 NOTE — Progress Notes (Signed)
No fungal elements seen. How is the cream doing? We can just prescribe the higher potency steroid now since no fungal elements seen.

## 2023-02-22 ENCOUNTER — Encounter: Payer: Self-pay | Admitting: Physician Assistant

## 2023-06-15 DIAGNOSIS — K648 Other hemorrhoids: Secondary | ICD-10-CM | POA: Diagnosis not present

## 2023-06-15 DIAGNOSIS — Z1211 Encounter for screening for malignant neoplasm of colon: Secondary | ICD-10-CM | POA: Diagnosis not present

## 2023-06-15 DIAGNOSIS — K635 Polyp of colon: Secondary | ICD-10-CM | POA: Diagnosis not present

## 2024-08-19 DIAGNOSIS — S27321A Contusion of lung, unilateral, initial encounter: Secondary | ICD-10-CM | POA: Diagnosis not present

## 2024-08-19 DIAGNOSIS — M545 Low back pain, unspecified: Secondary | ICD-10-CM | POA: Diagnosis not present

## 2024-08-19 DIAGNOSIS — M546 Pain in thoracic spine: Secondary | ICD-10-CM | POA: Diagnosis not present

## 2024-08-19 DIAGNOSIS — S299XXA Unspecified injury of thorax, initial encounter: Secondary | ICD-10-CM | POA: Diagnosis not present

## 2024-08-19 DIAGNOSIS — Z041 Encounter for examination and observation following transport accident: Secondary | ICD-10-CM | POA: Diagnosis not present

## 2024-08-19 DIAGNOSIS — S2241XA Multiple fractures of ribs, right side, initial encounter for closed fracture: Secondary | ICD-10-CM | POA: Diagnosis not present

## 2024-08-19 DIAGNOSIS — T1490XA Injury, unspecified, initial encounter: Secondary | ICD-10-CM | POA: Diagnosis not present

## 2024-08-21 DIAGNOSIS — S52691A Other fracture of lower end of right ulna, initial encounter for closed fracture: Secondary | ICD-10-CM | POA: Diagnosis not present

## 2024-08-21 DIAGNOSIS — T1490XA Injury, unspecified, initial encounter: Secondary | ICD-10-CM | POA: Diagnosis not present

## 2024-08-28 ENCOUNTER — Telehealth: Payer: Self-pay

## 2024-08-28 NOTE — Transitions of Care (Post Inpatient/ED Visit) (Unsigned)
" ° °  08/28/2024  Name: Willie Edwards MRN: [id number] DOB: 1971-04-24  Today's TOC FU Call Status: Today's TOC FU Call Status:: Unsuccessful Call (1st Attempt) Unsuccessful Call (1st Attempt) Date: 08/28/24  Attempted to reach the patient regarding the most recent Inpatient/ED visit.  Follow Up Plan: Additional outreach attempts will be made to reach the patient to complete the Transitions of Care (Post Inpatient/ED visit) call.   Signature  Charmaine Bloodgood, LPN [facility] Health Advisor [facility] You Are. We Are. [facility] Direct Dial [phone]   "
